# Patient Record
Sex: Female | Born: 1945 | Race: Black or African American | Hispanic: No | Marital: Married
Health system: Midwestern US, Academic
[De-identification: ages and names within clinical notes are randomized; demographics above are authoritative.]

---

## 2017-05-17 LAB — LIPID PROFILE
Lab: 12
Lab: 64

## 2017-05-17 LAB — COMPREHENSIVE METABOLIC PANEL
Lab: 12
Lab: 24
Lab: 4.3
Lab: 61
Lab: 7
Lab: 70
Lab: 92

## 2017-05-17 LAB — HEMOGLOBIN A1C: Lab: 6 — ABNORMAL HIGH (ref 4.8–5.6)

## 2017-09-01 ENCOUNTER — Encounter: Admit: 2017-09-01 | Discharge: 2017-09-01 | Payer: MEDICARE

## 2017-09-02 ENCOUNTER — Encounter: Admit: 2017-09-02 | Discharge: 2017-09-02 | Payer: MEDICARE

## 2017-09-13 ENCOUNTER — Ambulatory Visit: Admit: 2017-09-13 | Discharge: 2017-09-14 | Payer: MEDICARE

## 2017-09-13 ENCOUNTER — Encounter: Admit: 2017-09-13 | Discharge: 2017-09-13 | Payer: MEDICARE

## 2017-09-13 DIAGNOSIS — I440 Atrioventricular block, first degree: ICD-10-CM

## 2017-09-13 DIAGNOSIS — E785 Hyperlipidemia, unspecified: ICD-10-CM

## 2017-09-13 DIAGNOSIS — I10 Essential (primary) hypertension: Principal | ICD-10-CM

## 2017-09-13 DIAGNOSIS — I4892 Unspecified atrial flutter: ICD-10-CM

## 2017-09-13 DIAGNOSIS — I517 Cardiomegaly: ICD-10-CM

## 2017-09-13 DIAGNOSIS — I480 Paroxysmal atrial fibrillation: ICD-10-CM

## 2017-09-13 DIAGNOSIS — E669 Obesity, unspecified: ICD-10-CM

## 2017-09-13 DIAGNOSIS — E7800 Pure hypercholesterolemia, unspecified: ICD-10-CM

## 2017-09-13 DIAGNOSIS — I483 Typical atrial flutter: Principal | ICD-10-CM

## 2017-09-13 DIAGNOSIS — R001 Bradycardia, unspecified: ICD-10-CM

## 2017-09-14 ENCOUNTER — Encounter: Admit: 2017-09-14 | Discharge: 2017-09-14 | Payer: MEDICARE

## 2017-09-21 ENCOUNTER — Encounter: Admit: 2017-09-21 | Discharge: 2017-09-21 | Payer: MEDICARE

## 2017-09-21 ENCOUNTER — Ambulatory Visit: Admit: 2017-09-21 | Discharge: 2017-09-22 | Payer: MEDICARE

## 2017-09-21 DIAGNOSIS — R001 Bradycardia, unspecified: Secondary | ICD-10-CM

## 2017-09-21 DIAGNOSIS — E785 Hyperlipidemia, unspecified: ICD-10-CM

## 2017-09-21 DIAGNOSIS — I4891 Unspecified atrial fibrillation: Principal | ICD-10-CM

## 2017-09-21 DIAGNOSIS — E669 Obesity, unspecified: ICD-10-CM

## 2017-09-21 DIAGNOSIS — I10 Essential (primary) hypertension: Principal | ICD-10-CM

## 2017-09-21 DIAGNOSIS — I517 Cardiomegaly: ICD-10-CM

## 2017-09-21 DIAGNOSIS — I482 Chronic atrial fibrillation, unspecified: ICD-10-CM

## 2017-09-21 DIAGNOSIS — I480 Paroxysmal atrial fibrillation: ICD-10-CM

## 2017-09-21 DIAGNOSIS — Z9889 Other specified postprocedural states: Principal | ICD-10-CM

## 2017-09-21 DIAGNOSIS — I4892 Unspecified atrial flutter: ICD-10-CM

## 2017-09-21 MED ORDER — LIDOCAINE HCL 2 % MM JELP
Freq: Once | TOPICAL | 0 refills | Status: CN
Start: 2017-09-21 — End: ?

## 2017-09-21 MED ORDER — PANTOPRAZOLE 40 MG PO TBEC
40 mg | ORAL_TABLET | Freq: Two times a day (BID) | ORAL | 0 refills | 90.00000 days | Status: AC
Start: 2017-09-21 — End: 2018-08-01

## 2017-09-21 MED ORDER — SODIUM CHLORIDE 0.9 % IV SOLP
INTRAVENOUS | 0 refills | Status: CN
Start: 2017-09-21 — End: ?

## 2017-09-21 MED ORDER — LIDOCAINE (PF) 10 MG/ML (1 %) IJ SOLN
.1-2 mL | INTRAMUSCULAR | 0 refills | Status: CN | PRN
Start: 2017-09-21 — End: ?

## 2017-09-22 ENCOUNTER — Encounter: Admit: 2017-09-22 | Discharge: 2017-09-22 | Payer: MEDICARE

## 2017-09-22 DIAGNOSIS — I480 Paroxysmal atrial fibrillation: Secondary | ICD-10-CM

## 2017-09-22 DIAGNOSIS — I484 Atypical atrial flutter: Principal | ICD-10-CM

## 2017-09-22 DIAGNOSIS — R001 Bradycardia, unspecified: ICD-10-CM

## 2017-09-23 ENCOUNTER — Ambulatory Visit: Admit: 2017-09-23 | Discharge: 2017-09-23 | Payer: MEDICARE

## 2017-09-23 DIAGNOSIS — I480 Paroxysmal atrial fibrillation: Principal | ICD-10-CM

## 2017-09-29 ENCOUNTER — Encounter: Admit: 2017-09-29 | Discharge: 2017-09-29 | Payer: MEDICARE

## 2017-09-29 DIAGNOSIS — I480 Paroxysmal atrial fibrillation: Principal | ICD-10-CM

## 2017-10-03 ENCOUNTER — Encounter: Admit: 2017-10-03 | Discharge: 2017-10-03 | Payer: MEDICARE

## 2017-10-03 MED ORDER — MAGNESIUM HYDROXIDE 2,400 MG/10 ML PO SUSP
10 mL | ORAL | 0 refills | Status: CN | PRN
Start: 2017-10-03 — End: ?

## 2017-10-03 MED ORDER — ACETAMINOPHEN 325 MG PO TAB
650 mg | ORAL | 0 refills | Status: CN | PRN
Start: 2017-10-03 — End: ?

## 2017-10-04 ENCOUNTER — Ambulatory Visit: Admit: 2017-10-04 | Discharge: 2017-10-04 | Payer: MEDICARE

## 2017-10-04 ENCOUNTER — Encounter: Admit: 2017-10-04 | Discharge: 2017-10-04 | Payer: MEDICARE

## 2017-10-04 ENCOUNTER — Ambulatory Visit: Admit: 2017-10-04 | Discharge: 2017-10-05 | Payer: MEDICARE

## 2017-10-04 DIAGNOSIS — I480 Paroxysmal atrial fibrillation: ICD-10-CM

## 2017-10-04 DIAGNOSIS — I517 Cardiomegaly: ICD-10-CM

## 2017-10-04 DIAGNOSIS — I10 Essential (primary) hypertension: Principal | ICD-10-CM

## 2017-10-04 DIAGNOSIS — Z01810 Encounter for preprocedural cardiovascular examination: ICD-10-CM

## 2017-10-04 DIAGNOSIS — R001 Bradycardia, unspecified: ICD-10-CM

## 2017-10-04 DIAGNOSIS — E785 Hyperlipidemia, unspecified: ICD-10-CM

## 2017-10-04 DIAGNOSIS — Z9889 Other specified postprocedural states: ICD-10-CM

## 2017-10-04 DIAGNOSIS — I482 Chronic atrial fibrillation: Principal | ICD-10-CM

## 2017-10-04 DIAGNOSIS — E669 Obesity, unspecified: ICD-10-CM

## 2017-10-04 DIAGNOSIS — Z8679 Personal history of other diseases of the circulatory system: ICD-10-CM

## 2017-10-04 DIAGNOSIS — I4892 Unspecified atrial flutter: ICD-10-CM

## 2017-10-04 LAB — BASIC METABOLIC PANEL
Lab: 105 mg/dL — ABNORMAL HIGH (ref 70–100)
Lab: 138 MMOL/L (ref 137–147)
Lab: 20 mg/dL (ref 7–25)
Lab: 28 MMOL/L (ref 21–30)
Lab: 4.1 MMOL/L (ref 3.5–5.1)

## 2017-10-04 MED ORDER — IOPAMIDOL 76 % IV SOLN
80 mL | Freq: Once | INTRAVENOUS | 0 refills | Status: CP
Start: 2017-10-04 — End: ?
  Administered 2017-10-04: 16:00:00 80 mL via INTRAVENOUS

## 2017-10-04 MED ORDER — SODIUM CHLORIDE 0.9 % IJ SOLN
50 mL | Freq: Once | INTRAVENOUS | 0 refills | Status: CP
Start: 2017-10-04 — End: ?
  Administered 2017-10-04: 16:00:00 50 mL via INTRAVENOUS

## 2017-10-05 ENCOUNTER — Encounter: Admit: 2017-10-05 | Discharge: 2017-10-05 | Payer: MEDICARE

## 2017-10-05 DIAGNOSIS — Z9889 Other specified postprocedural states: Principal | ICD-10-CM

## 2017-10-05 DIAGNOSIS — I480 Paroxysmal atrial fibrillation: ICD-10-CM

## 2017-10-06 ENCOUNTER — Encounter: Admit: 2017-10-06 | Discharge: 2017-10-06 | Payer: MEDICARE

## 2017-10-06 ENCOUNTER — Ambulatory Visit: Admit: 2017-10-06 | Discharge: 2017-10-06 | Payer: MEDICARE

## 2017-10-06 ENCOUNTER — Encounter: Admit: 2017-10-06 | Discharge: 2017-10-07 | Payer: MEDICARE

## 2017-10-06 DIAGNOSIS — E669 Obesity, unspecified: ICD-10-CM

## 2017-10-06 DIAGNOSIS — I4892 Unspecified atrial flutter: ICD-10-CM

## 2017-10-06 DIAGNOSIS — R001 Bradycardia, unspecified: ICD-10-CM

## 2017-10-06 DIAGNOSIS — E785 Hyperlipidemia, unspecified: ICD-10-CM

## 2017-10-06 DIAGNOSIS — I480 Paroxysmal atrial fibrillation: ICD-10-CM

## 2017-10-06 DIAGNOSIS — I10 Essential (primary) hypertension: Principal | ICD-10-CM

## 2017-10-06 DIAGNOSIS — I517 Cardiomegaly: ICD-10-CM

## 2017-10-06 LAB — POC ACTIVATED CLOTTING TIME
Lab: 184 s
Lab: 246 s
Lab: 286 s
Lab: 371 s
Lab: 379 s

## 2017-10-06 LAB — POC PT/INR: Lab: 1.2 (ref 0.8–1.2)

## 2017-10-06 MED ORDER — PROMETHAZINE 25 MG/ML IJ SOLN
6.25 mg | INTRAVENOUS | 0 refills | Status: DC | PRN
Start: 2017-10-06 — End: 2017-10-07

## 2017-10-06 MED ORDER — APIXABAN 5 MG PO TAB
5 mg | Freq: Two times a day (BID) | ORAL | 0 refills | Status: DC
Start: 2017-10-06 — End: 2017-10-07
  Administered 2017-10-07: 05:00:00 5 mg via ORAL

## 2017-10-06 MED ORDER — METOCLOPRAMIDE HCL 5 MG/ML IJ SOLN
10 mg | Freq: Once | INTRAVENOUS | 0 refills | Status: AC | PRN
Start: 2017-10-06 — End: ?

## 2017-10-06 MED ORDER — PROPOFOL INJ 10 MG/ML IV VIAL
0 refills | Status: DC
Start: 2017-10-06 — End: 2017-10-06

## 2017-10-06 MED ORDER — PHENYLEPHRINE IV DRIP (STD CONC)
0 refills | Status: DC
Start: 2017-10-06 — End: 2017-10-06

## 2017-10-06 MED ORDER — FENTANYL CITRATE (PF) 50 MCG/ML IJ SOLN
0 refills | Status: DC
Start: 2017-10-06 — End: 2017-10-06

## 2017-10-06 MED ORDER — MAGNESIUM HYDROXIDE 2,400 MG/10 ML PO SUSP
10 mL | ORAL | 0 refills | Status: DC | PRN
Start: 2017-10-06 — End: 2017-10-07

## 2017-10-06 MED ORDER — LISINOPRIL 20 MG PO TAB
40 mg | Freq: Every day | ORAL | 0 refills | Status: DC
Start: 2017-10-06 — End: 2017-10-07
  Administered 2017-10-07: 03:00:00 40 mg via ORAL

## 2017-10-06 MED ORDER — ACETAMINOPHEN 325 MG PO TAB
650 mg | ORAL | 0 refills | Status: DC | PRN
Start: 2017-10-06 — End: 2017-10-07

## 2017-10-06 MED ORDER — PROTAMINE IVPB
20 mg | INTRAVENOUS | 0 refills | Status: AC | PRN
Start: 2017-10-06 — End: ?

## 2017-10-06 MED ORDER — ONDANSETRON HCL (PF) 4 MG/2 ML IJ SOLN
INTRAVENOUS | 0 refills | Status: DC
Start: 2017-10-06 — End: 2017-10-06

## 2017-10-06 MED ORDER — HYDROCHLOROTHIAZIDE 25 MG PO TAB
25 mg | Freq: Every day | ORAL | 0 refills | Status: DC
Start: 2017-10-06 — End: 2017-10-07

## 2017-10-06 MED ORDER — SUCCINYLCHOLINE CHLORIDE 20 MG/ML IJ SOLN
INTRAVENOUS | 0 refills | Status: DC
Start: 2017-10-06 — End: 2017-10-06

## 2017-10-06 MED ORDER — SODIUM CHLORIDE 0.9 % IV SOLP
INTRAVENOUS | 0 refills | Status: DC
Start: 2017-10-06 — End: 2017-10-07
  Administered 2017-10-06 (×2): 1000.000 mL via INTRAVENOUS

## 2017-10-06 MED ORDER — FENTANYL CITRATE (PF) 50 MCG/ML IJ SOLN
50 ug | Freq: Once | INTRAVENOUS | 0 refills | Status: AC | PRN
Start: 2017-10-06 — End: ?

## 2017-10-06 MED ORDER — SIMVASTATIN 40 MG PO TAB
40 mg | Freq: Every evening | ORAL | 0 refills | Status: DC
Start: 2017-10-06 — End: 2017-10-07
  Administered 2017-10-07: 03:00:00 40 mg via ORAL

## 2017-10-06 MED ORDER — PROPOFOL INJ 10 MG/ML IV VIAL
0 refills | Status: DC
Start: 2017-10-06 — End: 2017-10-06
  Administered 2017-10-06 (×3): 40 mg via INTRAVENOUS

## 2017-10-06 MED ORDER — LIDOCAINE HCL 2 % MM JELP
Freq: Once | TOPICAL | 0 refills | Status: DC
Start: 2017-10-06 — End: 2017-10-07

## 2017-10-06 MED ORDER — PANTOPRAZOLE 40 MG PO TBEC
40 mg | Freq: Two times a day (BID) | ORAL | 0 refills | Status: DC
Start: 2017-10-06 — End: 2017-10-07
  Administered 2017-10-07: 03:00:00 40 mg via ORAL

## 2017-10-06 MED ORDER — FENTANYL CITRATE (PF) 50 MCG/ML IJ SOLN
25 ug | INTRAVENOUS | 0 refills | Status: DC | PRN
Start: 2017-10-06 — End: 2017-10-07

## 2017-10-06 MED ORDER — PROPOFOL 10 MG/ML IV EMUL 20 ML (INFUSION)(AM)(OR)
INTRAVENOUS | 0 refills | Status: DC
Start: 2017-10-06 — End: 2017-10-06
  Administered 2017-10-06: 17:00:00 140 ug/kg/min via INTRAVENOUS

## 2017-10-06 MED ORDER — HYDROCODONE-ACETAMINOPHEN 5-325 MG PO TAB
1 | ORAL | 0 refills | Status: DC | PRN
Start: 2017-10-06 — End: 2017-10-07

## 2017-10-06 MED ORDER — LIDOCAINE (PF) 200 MG/10 ML (2 %) IJ SYRG
0 refills | Status: DC
Start: 2017-10-06 — End: 2017-10-06

## 2017-10-06 MED ORDER — BENZOCAINE-MENTHOL 6-10 MG MM LOZG
1 | ORAL | 0 refills | Status: DC | PRN
Start: 2017-10-06 — End: 2017-10-07

## 2017-10-06 MED ORDER — LIDOCAINE (PF) 10 MG/ML (1 %) IJ SOLN
.1-2 mL | INTRAMUSCULAR | 0 refills | Status: DC | PRN
Start: 2017-10-06 — End: 2017-10-07

## 2017-10-06 MED ORDER — DIPHENHYDRAMINE HCL 50 MG/ML IJ SOLN
25 mg | Freq: Once | INTRAVENOUS | 0 refills | Status: AC | PRN
Start: 2017-10-06 — End: ?

## 2017-10-07 ENCOUNTER — Encounter: Admit: 2017-10-07 | Discharge: 2017-10-07 | Payer: MEDICARE

## 2017-10-07 DIAGNOSIS — R001 Bradycardia, unspecified: ICD-10-CM

## 2017-10-07 DIAGNOSIS — I484 Atypical atrial flutter: Principal | ICD-10-CM

## 2017-10-07 DIAGNOSIS — I440 Atrioventricular block, first degree: ICD-10-CM

## 2017-10-07 DIAGNOSIS — Z0183 Encounter for blood typing: ICD-10-CM

## 2017-10-07 DIAGNOSIS — I480 Paroxysmal atrial fibrillation: ICD-10-CM

## 2017-10-07 DIAGNOSIS — I441 Atrioventricular block, second degree: ICD-10-CM

## 2017-10-07 DIAGNOSIS — I4891 Unspecified atrial fibrillation: ICD-10-CM

## 2017-10-07 LAB — POC ACTIVATED CLOTTING TIME
Lab: 221 s
Lab: 262 s

## 2017-10-07 LAB — BASIC METABOLIC PANEL: Lab: 140 MMOL/L — ABNORMAL LOW (ref <150)

## 2017-10-07 LAB — CBC: Lab: 8.2 K/UL — ABNORMAL LOW (ref >60)

## 2017-10-07 MED ORDER — SODIUM CHLORIDE 0.9 % IV SOLP
INTRAVENOUS | 0 refills | Status: CN
Start: 2017-10-07 — End: ?

## 2017-10-07 MED ORDER — CEFAZOLIN INJ 1GM IVP
1 g | INTRAVENOUS | 0 refills | Status: CN
Start: 2017-10-07 — End: ?

## 2017-10-07 MED ORDER — LIDOCAINE (PF) 10 MG/ML (1 %) IJ SOLN
.1-2 mL | INTRAMUSCULAR | 0 refills | Status: CN | PRN
Start: 2017-10-07 — End: ?

## 2017-10-07 MED ORDER — CEFAZOLIN INJ 1GM IVP
2 g | Freq: Once | INTRAVENOUS | 0 refills | Status: CN
Start: 2017-10-07 — End: ?

## 2017-10-07 MED ORDER — ALUMINUM-MAGNESIUM HYDROXIDE 200-200 MG/5 ML PO SUSP
30 mL | Freq: Once | ORAL | 0 refills | Status: CP
Start: 2017-10-07 — End: ?
  Administered 2017-10-07: 10:00:00 30 mL via ORAL

## 2017-10-10 ENCOUNTER — Encounter: Admit: 2017-10-10 | Discharge: 2017-10-10 | Payer: MEDICARE

## 2017-10-10 DIAGNOSIS — E669 Obesity, unspecified: ICD-10-CM

## 2017-10-10 DIAGNOSIS — I480 Paroxysmal atrial fibrillation: ICD-10-CM

## 2017-10-10 DIAGNOSIS — E785 Hyperlipidemia, unspecified: ICD-10-CM

## 2017-10-10 DIAGNOSIS — R001 Bradycardia, unspecified: ICD-10-CM

## 2017-10-10 DIAGNOSIS — I517 Cardiomegaly: ICD-10-CM

## 2017-10-10 DIAGNOSIS — I4892 Unspecified atrial flutter: ICD-10-CM

## 2017-10-10 DIAGNOSIS — I10 Essential (primary) hypertension: Principal | ICD-10-CM

## 2017-10-12 ENCOUNTER — Encounter: Admit: 2017-10-12 | Discharge: 2017-10-12 | Payer: MEDICARE

## 2017-10-12 DIAGNOSIS — I517 Cardiomegaly: ICD-10-CM

## 2017-10-12 DIAGNOSIS — I10 Essential (primary) hypertension: Principal | ICD-10-CM

## 2017-10-12 DIAGNOSIS — I4892 Unspecified atrial flutter: ICD-10-CM

## 2017-10-12 DIAGNOSIS — E669 Obesity, unspecified: ICD-10-CM

## 2017-10-12 DIAGNOSIS — E785 Hyperlipidemia, unspecified: ICD-10-CM

## 2017-10-12 DIAGNOSIS — R001 Bradycardia, unspecified: ICD-10-CM

## 2017-10-12 DIAGNOSIS — I480 Paroxysmal atrial fibrillation: ICD-10-CM

## 2017-10-13 ENCOUNTER — Encounter: Admit: 2017-10-13 | Discharge: 2017-10-13 | Payer: MEDICARE

## 2017-10-13 DIAGNOSIS — R002 Palpitations: Principal | ICD-10-CM

## 2017-10-14 ENCOUNTER — Encounter: Admit: 2017-10-14 | Discharge: 2017-10-14 | Payer: MEDICARE

## 2017-10-14 ENCOUNTER — Ambulatory Visit: Admit: 2017-10-14 | Discharge: 2017-10-15 | Payer: MEDICARE

## 2017-10-15 DIAGNOSIS — R002 Palpitations: Principal | ICD-10-CM

## 2017-10-27 ENCOUNTER — Encounter: Admit: 2017-10-27 | Discharge: 2017-10-27 | Payer: MEDICARE

## 2017-10-28 ENCOUNTER — Encounter: Admit: 2017-10-28 | Discharge: 2017-10-28 | Payer: MEDICARE

## 2017-10-28 MED ORDER — ACETAMINOPHEN 325 MG PO TAB
650 mg | ORAL | 0 refills | Status: CN | PRN
Start: 2017-10-28 — End: ?

## 2017-10-28 MED ORDER — DOCUSATE SODIUM 100 MG PO CAP
100 mg | Freq: Every day | ORAL | 0 refills | Status: CN | PRN
Start: 2017-10-28 — End: ?

## 2017-10-28 MED ORDER — TEMAZEPAM 15 MG PO CAP
15 mg | Freq: Every evening | ORAL | 0 refills | Status: CN | PRN
Start: 2017-10-28 — End: ?

## 2017-10-28 MED ORDER — ALUMINUM-MAGNESIUM HYDROXIDE 200-200 MG/5 ML PO SUSP
30 mL | ORAL | 0 refills | Status: CN | PRN
Start: 2017-10-28 — End: ?

## 2017-11-01 ENCOUNTER — Encounter: Admit: 2017-11-01 | Discharge: 2017-11-01 | Payer: MEDICARE

## 2017-11-03 ENCOUNTER — Encounter: Admit: 2017-11-03 | Discharge: 2017-11-03 | Payer: MEDICARE

## 2017-11-03 ENCOUNTER — Ambulatory Visit: Admit: 2017-11-03 | Discharge: 2017-11-03 | Payer: MEDICARE

## 2017-11-03 ENCOUNTER — Encounter: Admit: 2017-11-03 | Discharge: 2017-11-04 | Payer: MEDICARE

## 2017-11-03 DIAGNOSIS — I480 Paroxysmal atrial fibrillation: ICD-10-CM

## 2017-11-03 DIAGNOSIS — R001 Bradycardia, unspecified: ICD-10-CM

## 2017-11-03 DIAGNOSIS — I517 Cardiomegaly: ICD-10-CM

## 2017-11-03 DIAGNOSIS — E785 Hyperlipidemia, unspecified: ICD-10-CM

## 2017-11-03 DIAGNOSIS — E669 Obesity, unspecified: ICD-10-CM

## 2017-11-03 DIAGNOSIS — I10 Essential (primary) hypertension: Principal | ICD-10-CM

## 2017-11-03 DIAGNOSIS — I4892 Unspecified atrial flutter: ICD-10-CM

## 2017-11-03 LAB — COMPREHENSIVE METABOLIC PANEL
Lab: 0.5 mg/dL (ref 0.3–1.2)
Lab: 1.1 mg/dL — ABNORMAL HIGH (ref 0.4–1.00)
Lab: 104 MMOL/L (ref 98–110)
Lab: 113 mg/dL — ABNORMAL HIGH (ref 70–100)
Lab: 138 MMOL/L (ref 137–147)
Lab: 18 U/L (ref 7–56)
Lab: 21 U/L (ref 7–40)
Lab: 21 mg/dL (ref 7–25)
Lab: 27 MMOL/L (ref 21–30)
Lab: 4.1 g/dL — ABNORMAL LOW (ref 3.5–5.0)
Lab: 49 mL/min — ABNORMAL LOW (ref >60)
Lab: 59 mL/min — ABNORMAL LOW (ref >60)
Lab: 7 K/UL (ref 3–12)
Lab: 7.2 g/dL (ref 6.0–8.0)
Lab: 80 U/L — ABNORMAL HIGH (ref 25–110)
Lab: 9.6 mg/dL (ref 8.5–10.6)

## 2017-11-03 LAB — CBC AND DIFF
Lab: 0.1 10*3/uL (ref 0–0.20)
Lab: 0.2 10*3/uL (ref 0–0.45)
Lab: 4.8 10*3/uL (ref 4.5–11.0)

## 2017-11-03 LAB — MAGNESIUM: Lab: 2.2 mg/dL (ref 1.6–2.6)

## 2017-11-03 MED ORDER — TEMAZEPAM 15 MG PO CAP
15 mg | Freq: Every evening | ORAL | 0 refills | Status: DC | PRN
Start: 2017-11-03 — End: 2017-11-04

## 2017-11-03 MED ORDER — LISINOPRIL 20 MG PO TAB
40 mg | Freq: Every day | ORAL | 0 refills | Status: DC
Start: 2017-11-03 — End: 2017-11-04
  Administered 2017-11-04: 14:00:00 40 mg via ORAL

## 2017-11-03 MED ORDER — HYDROCHLOROTHIAZIDE 12.5 MG PO TAB
12.5 mg | Freq: Every day | ORAL | 0 refills | Status: DC
Start: 2017-11-03 — End: 2017-11-04
  Administered 2017-11-04: 14:00:00 12.5 mg via ORAL

## 2017-11-03 MED ORDER — CEFAZOLIN INJ 1GM IVP
2 g | Freq: Once | INTRAVENOUS | 0 refills | Status: CP
Start: 2017-11-03 — End: ?

## 2017-11-03 MED ORDER — APIXABAN 5 MG PO TAB
5 mg | Freq: Two times a day (BID) | ORAL | 0 refills | Status: DC
Start: 2017-11-03 — End: 2017-11-04
  Administered 2017-11-04 (×2): 5 mg via ORAL

## 2017-11-03 MED ORDER — SIMVASTATIN 40 MG PO TAB
40 mg | Freq: Every evening | ORAL | 0 refills | Status: DC
Start: 2017-11-03 — End: 2017-11-04
  Administered 2017-11-04: 02:00:00 40 mg via ORAL

## 2017-11-03 MED ORDER — HYDROCODONE-ACETAMINOPHEN 5-325 MG PO TAB
1 | ORAL | 0 refills | Status: DC | PRN
Start: 2017-11-03 — End: 2017-11-04
  Administered 2017-11-03 – 2017-11-04 (×2): 1 via ORAL

## 2017-11-03 MED ORDER — ACETAMINOPHEN 325 MG PO TAB
650 mg | ORAL | 0 refills | Status: DC | PRN
Start: 2017-11-03 — End: 2017-11-03

## 2017-11-03 MED ORDER — ACETAMINOPHEN 325 MG PO TAB
325-650 mg | ORAL | 0 refills | Status: DC | PRN
Start: 2017-11-03 — End: 2017-11-04
  Administered 2017-11-04: 12:00:00 650 mg via ORAL

## 2017-11-03 MED ORDER — SODIUM CHLORIDE 0.9 % IV SOLP
INTRAVENOUS | 0 refills | Status: DC
Start: 2017-11-03 — End: 2017-11-04
  Administered 2017-11-03: 12:00:00 1000 mL via INTRAVENOUS

## 2017-11-03 MED ORDER — LIDOCAINE (PF) 10 MG/ML (1 %) IJ SOLN
.1-2 mL | INTRAMUSCULAR | 0 refills | Status: DC | PRN
Start: 2017-11-03 — End: 2017-11-04

## 2017-11-03 MED ORDER — CEFAZOLIN INJ 1GM IVP
1 g | INTRAVENOUS | 0 refills | Status: CP
Start: 2017-11-03 — End: ?
  Administered 2017-11-03: 22:00:00 1 g via INTRAVENOUS

## 2017-11-03 MED ORDER — PANTOPRAZOLE 40 MG PO TBEC
40 mg | Freq: Two times a day (BID) | ORAL | 0 refills | Status: DC
Start: 2017-11-03 — End: 2017-11-04
  Administered 2017-11-04 (×2): 40 mg via ORAL

## 2017-11-03 MED ORDER — LISINOPRIL 20 MG PO TAB
40 mg | Freq: Once | ORAL | 0 refills | Status: CP
Start: 2017-11-03 — End: ?
  Administered 2017-11-03: 14:00:00 40 mg via ORAL

## 2017-11-03 MED ORDER — PANTOPRAZOLE 40 MG PO TBEC
40 mg | ORAL_TABLET | Freq: Two times a day (BID) | ORAL | 0 refills
Start: 2017-11-03 — End: ?

## 2017-11-03 MED ORDER — ALUMINUM-MAGNESIUM HYDROXIDE 200-200 MG/5 ML PO SUSP
30 mL | ORAL | 0 refills | Status: DC | PRN
Start: 2017-11-03 — End: 2017-11-04

## 2017-11-03 MED ORDER — DOCUSATE SODIUM 100 MG PO CAP
100 mg | Freq: Every day | ORAL | 0 refills | Status: DC | PRN
Start: 2017-11-03 — End: 2017-11-04

## 2017-11-04 ENCOUNTER — Encounter: Admit: 2017-11-04 | Discharge: 2017-11-04 | Payer: MEDICARE

## 2017-11-04 ENCOUNTER — Encounter: Admit: 2017-11-04 | Discharge: 2017-11-05 | Payer: MEDICARE

## 2017-11-04 DIAGNOSIS — R001 Bradycardia, unspecified: Secondary | ICD-10-CM

## 2017-11-04 DIAGNOSIS — I441 Atrioventricular block, second degree: Principal | ICD-10-CM

## 2017-11-04 LAB — BASIC METABOLIC PANEL
Lab: 0.8 mg/dL (ref 0.4–1.00)
Lab: 101 mg/dL — ABNORMAL HIGH (ref 70–100)
Lab: 138 MMOL/L (ref 137–147)
Lab: 15 mg/dL (ref 7–25)
Lab: 25 MMOL/L (ref 21–30)
Lab: 3.4 MMOL/L — ABNORMAL LOW (ref 3.5–5.1)
Lab: 8.4 mg/dL — ABNORMAL LOW (ref 8.5–10.6)
Lab: >60 mL/min (ref >60)
Lab: >60 mL/min (ref >60)

## 2017-11-04 LAB — CBC: Lab: 5.8 10*3/uL (ref 4.5–11.0)

## 2017-11-04 MED ORDER — HYDROCODONE-ACETAMINOPHEN 5-300 MG PO TAB
1 | ORAL_TABLET | ORAL | 0 refills | 30.00000 days | Status: AC | PRN
Start: 2017-11-04 — End: 2018-08-01

## 2017-11-04 MED ORDER — POTASSIUM CHLORIDE 20 MEQ PO TBTQ
40 meq | Freq: Once | ORAL | 0 refills | Status: CP
Start: 2017-11-04 — End: ?
  Administered 2017-11-04: 14:00:00 40 meq via ORAL

## 2017-11-07 ENCOUNTER — Encounter: Admit: 2017-11-07 | Discharge: 2017-11-07 | Payer: MEDICARE

## 2017-11-08 ENCOUNTER — Encounter: Admit: 2017-11-08 | Discharge: 2017-11-08 | Payer: MEDICARE

## 2017-11-14 ENCOUNTER — Encounter: Admit: 2017-11-14 | Discharge: 2017-11-14 | Payer: MEDICARE

## 2017-11-14 ENCOUNTER — Ambulatory Visit: Admit: 2017-11-14 | Discharge: 2017-11-15 | Payer: MEDICARE

## 2017-11-14 DIAGNOSIS — I4819 Other persistent atrial fibrillation: Principal | ICD-10-CM

## 2017-11-15 ENCOUNTER — Encounter: Admit: 2017-11-15 | Discharge: 2017-11-15 | Payer: MEDICARE

## 2017-11-15 DIAGNOSIS — I481 Persistent atrial fibrillation: Principal | ICD-10-CM

## 2017-11-18 ENCOUNTER — Encounter: Admit: 2017-11-18 | Discharge: 2017-11-18 | Payer: MEDICARE

## 2017-12-26 ENCOUNTER — Encounter: Admit: 2017-12-26 | Discharge: 2017-12-26 | Payer: MEDICARE

## 2018-01-04 ENCOUNTER — Ambulatory Visit: Admit: 2018-01-04 | Discharge: 2018-01-04 | Payer: MEDICARE

## 2018-01-04 ENCOUNTER — Encounter: Admit: 2018-01-04 | Discharge: 2018-01-04 | Payer: MEDICARE

## 2018-01-04 DIAGNOSIS — Z950 Presence of cardiac pacemaker: ICD-10-CM

## 2018-01-04 DIAGNOSIS — I484 Atypical atrial flutter: ICD-10-CM

## 2018-01-04 DIAGNOSIS — I10 Essential (primary) hypertension: Principal | ICD-10-CM

## 2018-01-04 DIAGNOSIS — I481 Persistent atrial fibrillation: ICD-10-CM

## 2018-01-04 DIAGNOSIS — I517 Cardiomegaly: ICD-10-CM

## 2018-01-04 DIAGNOSIS — E669 Obesity, unspecified: ICD-10-CM

## 2018-01-04 DIAGNOSIS — I441 Atrioventricular block, second degree: Principal | ICD-10-CM

## 2018-01-04 DIAGNOSIS — R001 Bradycardia, unspecified: ICD-10-CM

## 2018-01-04 DIAGNOSIS — I4892 Unspecified atrial flutter: ICD-10-CM

## 2018-01-04 DIAGNOSIS — I4819 Other persistent atrial fibrillation: Principal | ICD-10-CM

## 2018-01-04 DIAGNOSIS — E785 Hyperlipidemia, unspecified: ICD-10-CM

## 2018-01-04 DIAGNOSIS — I480 Paroxysmal atrial fibrillation: ICD-10-CM

## 2018-01-07 ENCOUNTER — Encounter: Admit: 2018-01-07 | Discharge: 2018-01-07 | Payer: MEDICARE

## 2018-01-07 DIAGNOSIS — I517 Cardiomegaly: ICD-10-CM

## 2018-01-07 DIAGNOSIS — I480 Paroxysmal atrial fibrillation: ICD-10-CM

## 2018-01-07 DIAGNOSIS — R001 Bradycardia, unspecified: ICD-10-CM

## 2018-01-07 DIAGNOSIS — I10 Essential (primary) hypertension: Principal | ICD-10-CM

## 2018-01-07 DIAGNOSIS — E785 Hyperlipidemia, unspecified: ICD-10-CM

## 2018-01-07 DIAGNOSIS — I4892 Unspecified atrial flutter: ICD-10-CM

## 2018-01-07 DIAGNOSIS — E669 Obesity, unspecified: ICD-10-CM

## 2018-01-11 ENCOUNTER — Encounter: Admit: 2018-01-11 | Discharge: 2018-01-11 | Payer: MEDICARE

## 2018-01-16 ENCOUNTER — Encounter: Admit: 2018-01-16 | Discharge: 2018-01-16 | Payer: MEDICARE

## 2018-02-13 ENCOUNTER — Ambulatory Visit: Admit: 2018-02-13 | Discharge: 2018-02-14 | Payer: MEDICARE

## 2018-02-13 DIAGNOSIS — I481 Persistent atrial fibrillation: Secondary | ICD-10-CM

## 2018-02-13 DIAGNOSIS — I4819 Other persistent atrial fibrillation: ICD-10-CM

## 2018-02-14 DIAGNOSIS — I441 Atrioventricular block, second degree: Principal | ICD-10-CM

## 2018-02-21 ENCOUNTER — Ambulatory Visit: Admit: 2018-02-21 | Discharge: 2018-02-21 | Payer: MEDICARE

## 2018-02-21 ENCOUNTER — Ambulatory Visit: Admit: 2018-02-21 | Discharge: 2018-02-22 | Payer: MEDICARE

## 2018-02-21 ENCOUNTER — Encounter: Admit: 2018-02-21 | Discharge: 2018-02-21 | Payer: MEDICARE

## 2018-02-21 DIAGNOSIS — Z950 Presence of cardiac pacemaker: ICD-10-CM

## 2018-02-21 DIAGNOSIS — I481 Persistent atrial fibrillation: Principal | ICD-10-CM

## 2018-02-21 LAB — POC CREATININE, RAD: Lab: 1 mg/dL (ref 0.4–1.00)

## 2018-02-21 MED ORDER — SODIUM CHLORIDE 0.9 % IJ SOLN
50 mL | Freq: Once | INTRAVENOUS | 0 refills | Status: CP
Start: 2018-02-21 — End: ?
  Administered 2018-02-21: 14:00:00 50 mL via INTRAVENOUS

## 2018-02-21 MED ORDER — IOPAMIDOL 76 % IV SOLN
80 mL | Freq: Once | INTRAVENOUS | 0 refills | Status: CP
Start: 2018-02-21 — End: ?
  Administered 2018-02-21: 14:00:00 80 mL via INTRAVENOUS

## 2018-02-23 ENCOUNTER — Encounter: Admit: 2018-02-23 | Discharge: 2018-02-23 | Payer: MEDICARE

## 2018-05-08 ENCOUNTER — Encounter: Admit: 2018-05-08 | Discharge: 2018-05-08 | Payer: MEDICARE

## 2018-05-08 DIAGNOSIS — Z950 Presence of cardiac pacemaker: Principal | ICD-10-CM

## 2018-05-15 ENCOUNTER — Ambulatory Visit: Admit: 2018-05-15 | Discharge: 2018-05-15 | Payer: MEDICARE

## 2018-05-15 DIAGNOSIS — I4819 Other persistent atrial fibrillation: Principal | ICD-10-CM

## 2018-06-20 ENCOUNTER — Encounter: Admit: 2018-06-20 | Discharge: 2018-06-20 | Payer: MEDICARE

## 2018-06-20 ENCOUNTER — Ambulatory Visit: Admit: 2018-06-20 | Discharge: 2018-06-21 | Payer: MEDICARE

## 2018-06-20 DIAGNOSIS — I4819 Other persistent atrial fibrillation: Secondary | ICD-10-CM

## 2018-06-20 DIAGNOSIS — I441 Atrioventricular block, second degree: Secondary | ICD-10-CM

## 2018-06-20 DIAGNOSIS — Z950 Presence of cardiac pacemaker: Secondary | ICD-10-CM

## 2018-06-27 LAB — COMPREHENSIVE METABOLIC PANEL
Lab: 103
Lab: 16
Lab: 36
Lab: 4.4
Lab: 58 — ABNORMAL LOW (ref >60)
Lab: 67
Lab: 7.2
Lab: 7.2

## 2018-06-27 LAB — LIPID PROFILE: Lab: 10

## 2018-07-05 ENCOUNTER — Encounter: Admit: 2018-07-05 | Discharge: 2018-07-05 | Payer: MEDICARE

## 2018-08-01 ENCOUNTER — Ambulatory Visit: Admit: 2018-08-01 | Discharge: 2018-08-01 | Payer: MEDICARE

## 2018-08-01 ENCOUNTER — Encounter: Admit: 2018-08-01 | Discharge: 2018-08-01 | Payer: MEDICARE

## 2018-08-01 DIAGNOSIS — R001 Bradycardia, unspecified: ICD-10-CM

## 2018-08-01 DIAGNOSIS — Z950 Presence of cardiac pacemaker: ICD-10-CM

## 2018-08-01 DIAGNOSIS — I10 Essential (primary) hypertension: Principal | ICD-10-CM

## 2018-08-01 DIAGNOSIS — I4892 Unspecified atrial flutter: ICD-10-CM

## 2018-08-01 DIAGNOSIS — I517 Cardiomegaly: ICD-10-CM

## 2018-08-01 DIAGNOSIS — E669 Obesity, unspecified: ICD-10-CM

## 2018-08-01 DIAGNOSIS — E785 Hyperlipidemia, unspecified: ICD-10-CM

## 2018-08-01 DIAGNOSIS — I440 Atrioventricular block, first degree: ICD-10-CM

## 2018-08-01 DIAGNOSIS — I480 Paroxysmal atrial fibrillation: ICD-10-CM

## 2018-08-01 DIAGNOSIS — M170 Bilateral primary osteoarthritis of knee: Principal | ICD-10-CM

## 2018-08-01 NOTE — Progress Notes
Date of Service: 08/01/2018    Hannah Andrade is a 73 y.o. female.       HPI     Hannah Andrade reports for follow-up after she seen Dr. Bradly Bienenstock twice in clinic and had 2 procedures after I asked Hannah Andrade to see Dr. Bradly Bienenstock for possible repeat a flutter ablation.  Hannah Andrade had previous a flutter ablation in [place] but had recurrence after being too bradycardic on sotalol.  In accord with my thinking Dr. Bradly Bienenstock recommended ablation.  The procedure was successful but patient had some AV block and bradycardia afterwards so I had an elective pacemaker placed on a subsequent admission.  That she never had syncope.  She is done well with the device implant with no procedural complications no fevers chills or device infection or pain.  She is not felt sustained arrhythmia suggesting breakthrough atrial flutter or fib.    This morning she noted some sense of fluttering that she had not had before she did not think this was atrial flutter but she was wondering what it was.    She states that she is free from exertional chest pain or pressure that limits activity.  She is having no PND, orthopnea,  edema, stay tachypalpitations, syncope, near syncope, or postural lightheadedness.  She has had no  focal motor defects, speech defects or cognitive defects that would suggest TIA. She is not limiting her activity in order to avoid chest discomfort or shortness of breath with exertion.Hannah Andrade is not limiting his activities of daily living in any way to avoid exertional chest pain, pressure or shortness of air     She  is tolerating all of her medications well, is not missing any doses and has no specific complaints about any of them.           Vitals:    08/01/18 1331 08/01/18 1334   BP: 132/78 126/72   BP Source: Arm, Left Upper Arm, Right Upper   Pulse: 73    SpO2: 98%    Weight: 85.3 kg (188 lb)    Height: 1.473 m (4' 10)    PainSc: Zero      Body mass index is 39.29 kg/m???.     Past Medical History Patient Active Problem List    Diagnosis Date Noted   ??? 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 Bradly Bienenstock afpter 2nd atrial flutter ablation     ??? 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 Bradycardia 11/03/2017     Chronic HR 40s - 50s on sotalol 40 BID.   Post 2019 atrial flutter ablation bradycardia. Pacer implant Dr. Bradly Bienenstock     ??? On continuous oral anticoagulation 08/31/2016   ??? At risk for cardiovascular event: Normal cor angio 2015 08/12/2015     08/2013: Patient reports having normal coronary angiography done prior to atrial fibrillation Ablation, [facility], [facility].      ??? Primary osteoarthritis of both knees 08/12/2015     05/12/15 right TKA  [facility] Dr. Aundria Rud, no problems  2018 L TKA Dr. Aundria Rud     ??? Palpitations 09/02/2014   ??? First degree AV block 03/19/2014     03/18/14--PR interval 440  MAC OV, increased from 340 in July on Sotalol 40 BID, Sotalol DC'd      ??? S/P ablation of atrial fibrillation 01/01/2014     08/14/13 [facility] [place] Dr. Hilarie Fredrickson     ??? Atrial flutter (  HCC) 12/27/2013     06/2013 Paroxysmal atrial flutter diagnosed in ER  09/03/13 Cryoablation for atrial fib via PVI x 4 &.  RF ablation of typical cavotricuspid isthmus atrial flutter. [facility], [facility]  10/15 Fatigue & Bradycardia w/ Sotalol,DC'd. Notes increase of some palpitations but cessation of fatigue and bradycardia  09/2017 Recurrent symptomatic atrial flutter, ablated Dr. Yetta Numbers, post ablation brady>>DDD Pacer implan     ??? LAE (left atrial enlargement) 12/27/2013   ??? Obesity 12/27/2013   ??? Hypertension 12/26/2013     7/15 add HCTZ MAC Clinic Echo 01/2014:  EF 65% Walls 1.1 c.w mild LVH.          ??? Atrial fibrillation (HCC) 12/26/2013     08/14/2013: PVI for AF persistingw/ HR to 40s on Sotallol 40 BID @ [facility] Dr. Hilarie Fredrickson same setting as atrial flutter  Ablation, continues .sotalol,  Sotalol DC'd due to malainse, fatigue ??? Primary osteoarthritis of both knees    ??? Bradycardia    ??? Atrial flutter, unspecified type (HCC)    ??? Pacemaker        Assessment and Plan     Hannah Andrade is doing well.  Her pacemaker implant went well.  She is not having any problems with the pacemaker.  She was disappointed that she needed it but with her previous bradycardia on sotalol she understands that she is now guaranteed a minimum heart rate regardless of what medications she might need to be on.  The device is not causing her any discomfort.  Her atrial flutter ablation has been successful as she is now about 10 months out from the procedure.  She does not have any other active cardiovascular problems so I have put her on 1 year follow-up with me.  She can see Dr. Bradly Bienenstock as needed.  With Dr. Bradly Bienenstock not in patients in [place] is just going to follow-up with me unless something changes that requires EP specialist attention.      NB: The free text in this document was generated through Dragon(TM) software with editing and proofreading  done by the author of this document Dr. Mable Paris MD, [facility] principally at the point of care. Some errors may persist.  If there are questions about content in this document please contact Dr. Hale Bogus.    The written information I provided Hannah Andrade at the conclusion of today's encounter is as  follows:    Patient Instructions   You are doing well,   Pacer records fast rhythms, we'll do remote interrogation to see what happened this morning. We'll let you know later today.     Please establish a moderate exercise program of three hours a week. You don't need speed, just consistency. Don't overdo it in a way that hurts your bones, joints or muscles. Strength, stamina, flexibility and balance are your goals with exercise.      Here are your blood pressure goals:   Systolic (top number): Over 90-100 minimum, usually 110-130. Occasionally approaching 140,  no 150s. Diastolic (bottom number):  80 or less, rarely could accept 85-90.      Return to see me for a recheck in one year.   I can see you sooner if needed.   Call in if you have problems or questions.   Hannah Nestle, MD                Current Medications (including today's revisions)  ??? alendronate (FOSAMAX) 70 mg  tablet Take 1 tablet by mouth every 7 days.   ??? apixaban (ELIQUIS) 5 mg tablet Take 1 tablet by mouth twice daily.   ??? calcium carbonate/vitamin D-3 (OSCAL-500+D) 1250 mg/200 unit tablet Take 1 tablet by mouth daily. Calcium Carb 1250mg  delivers 500mg  elemental Ca   ??? hydrochlorothiazide (HYDRODIURIL) 25 mg tablet Take 1 Tab by mouth daily.   ??? lisinopril (PRINIVIL, ZESTRIL) 40 mg tablet Take 1 tablet by mouth daily.   ??? simvastatin (ZOCOR) 40 mg tablet Take 40 mg by mouth at bedtime daily.   ??? vitamins, multiple cap Take 1 Cap by mouth daily.

## 2018-08-14 DIAGNOSIS — I4819 Other persistent atrial fibrillation: ICD-10-CM

## 2018-08-14 DIAGNOSIS — I442 Atrioventricular block, complete: Principal | ICD-10-CM

## 2018-08-15 ENCOUNTER — Ambulatory Visit: Admit: 2018-08-15 | Discharge: 2018-08-14 | Payer: MEDICARE

## 2018-08-15 IMAGING — CT Lower Extremities^1_CONFORMIS_LOWER_EXT (Adult)
1 of 3 series · 10 of 14 positions shown, 13 images · non-contrast
Comparison: none

[Series 2: knee conform 1.0 bone · axial · 0.39mm/px · z∈[-601,-433]mm · 10 of 405 slices shown, 13 images]
[im 37/405  soft-tissue]
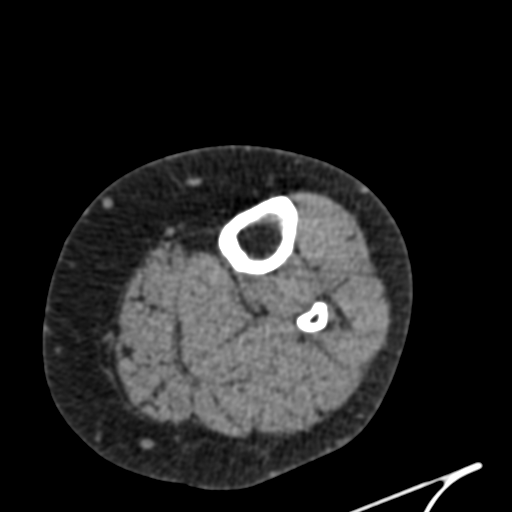
[im 37/405  bone]
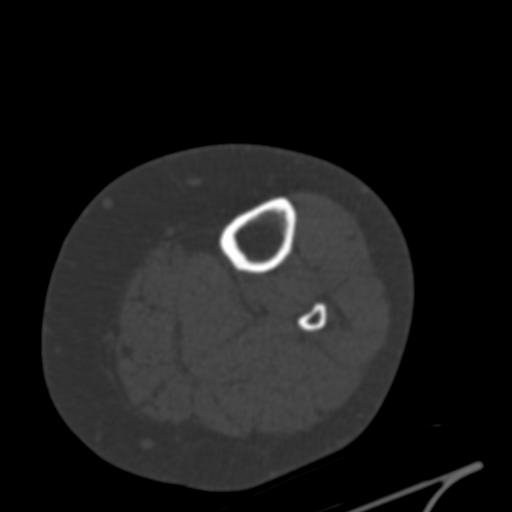
[im 74/405  bone]
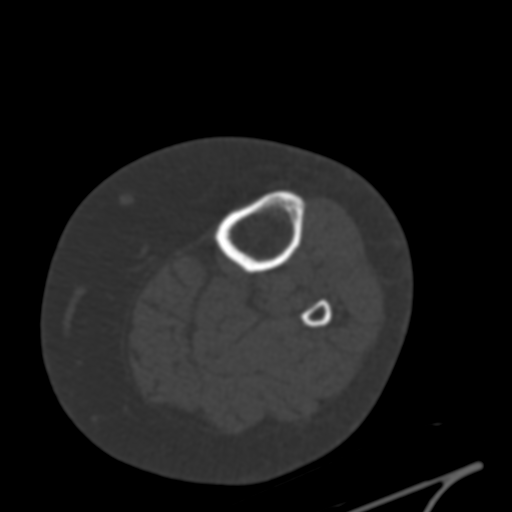
[im 111/405  bone]
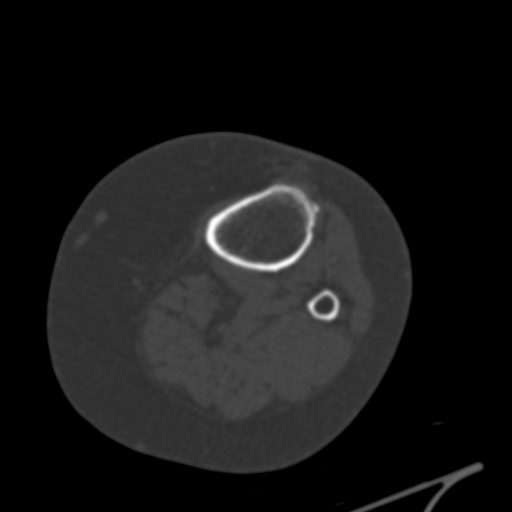
[im 147/405  bone]
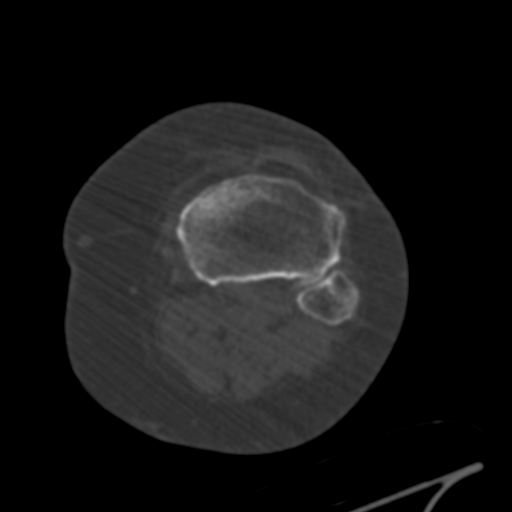
[im 184/405  soft-tissue]
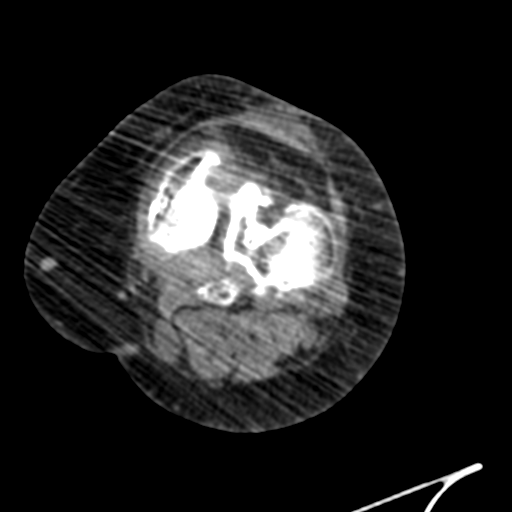
[im 184/405  bone]
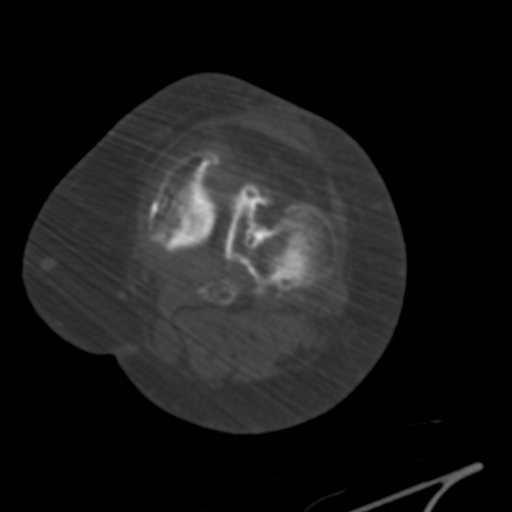
[im 221/405  bone]
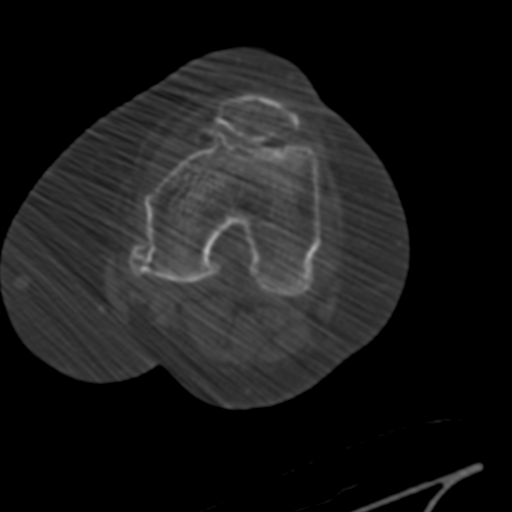
[im 258/405  bone]
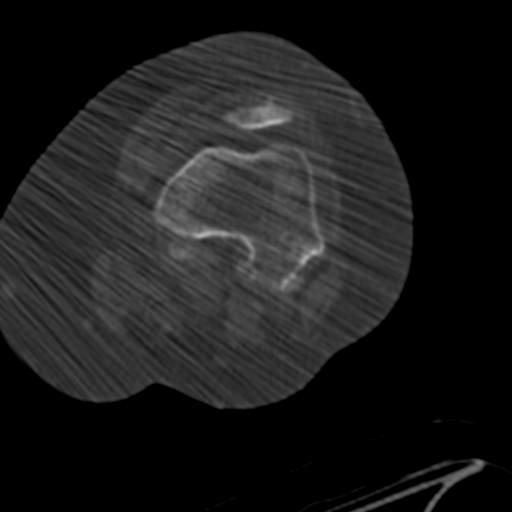
[im 294/405  bone]
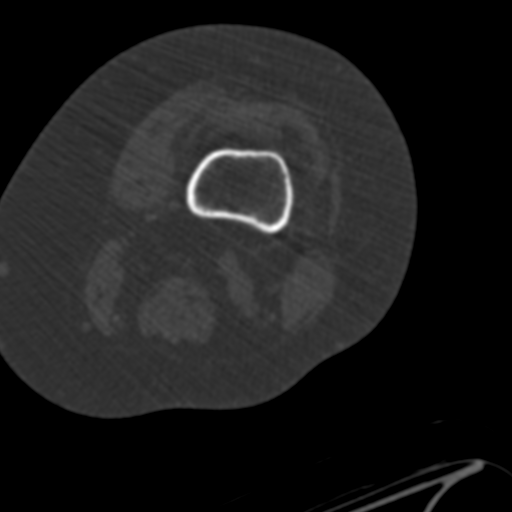
[im 331/405  soft-tissue]
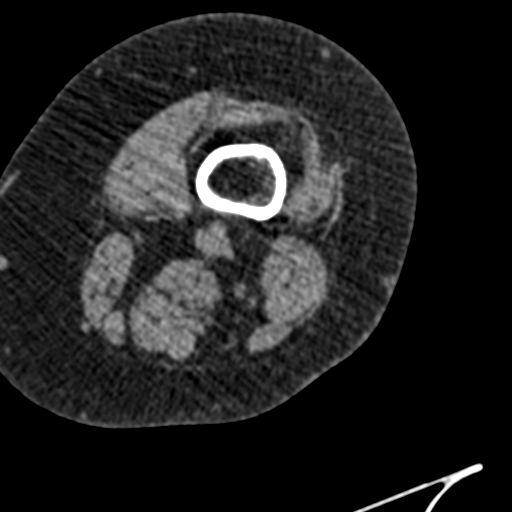
[im 331/405  bone]
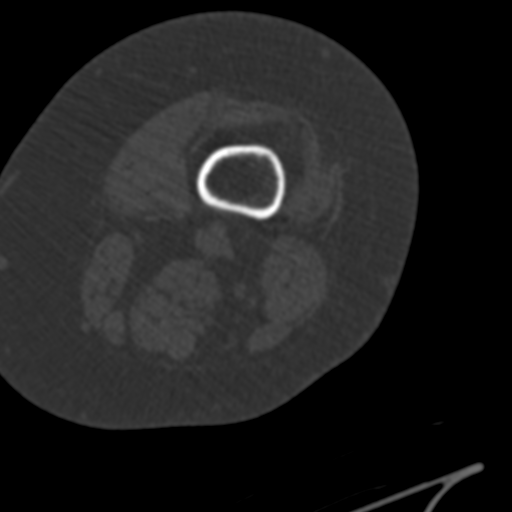
[im 368/405  bone]
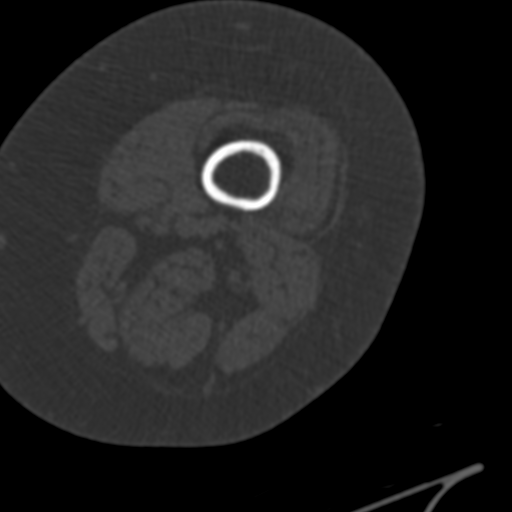

[10 of 14 positions shown; findings below may reference images not displayed]

DIAGNOSTIC STUDIES

EXAM

CT left knee, CONFORMIS protocol

INDICATION

planned knee replacement
PT STATES LEFT KNEE PAIN. PLANNED LEFT KNEE REPLACEMENT. CONFORMIS
PROTOCOL. TJ

TECHNIQUE

Axial CT of the left knee was performed with 1 millimeter thickness slices. Coronal, sagittal,
axial bone reformats were obtained. 2 millimeter slices through the hip and ankle were performed.
All CT scans at this facility use dose modulation, iterative reconstruction, and/or weight based
dosing when appropriate to reduce radiation dose to as low as reasonably achievable.

COMPARISONS

None

FINDINGS

No acute fracture. Alignment is normal. There is tricompartmental osteoarthrosis, greatest in the
patellofemoral and medial compartments where there is moderate to severe joint space narrowing.
There is bulky tricompartmental osteophyte formation, greatest in the medial compartment. Trace
joint effusion. Muscles and subcutaneous soft tissue are unremarkable.

There is left hip joint space narrowing posteriorly. The ankle is unremarkable.

IMPRESSION

Tricompartmental osteoarthrosis of the knee, greatest in the patellofemoral and medial
compartments where it is moderate to severe.

## 2018-11-13 ENCOUNTER — Encounter: Admit: 2018-11-13 | Discharge: 2018-11-13

## 2018-11-13 ENCOUNTER — Ambulatory Visit: Admit: 2018-11-13 | Discharge: 2018-11-13

## 2018-11-13 DIAGNOSIS — I4819 Other persistent atrial fibrillation: Secondary | ICD-10-CM

## 2018-11-13 DIAGNOSIS — I440 Atrioventricular block, first degree: Secondary | ICD-10-CM

## 2018-11-13 DIAGNOSIS — I480 Paroxysmal atrial fibrillation: Secondary | ICD-10-CM

## 2018-11-13 DIAGNOSIS — I441 Atrioventricular block, second degree: Secondary | ICD-10-CM

## 2018-11-21 IMAGING — CR LOW_EXM
2 series · 2 of 2 positions shown · non-contrast
Comparison: Left knee radiographs May 12, 2015 and left knee CT June 29, 2016.

EXAM

2 VIEW LEFT KNEE
INDICATION
S/P Replacement of Knee
S/P KNEE REPLACEMENT

[knee ap]
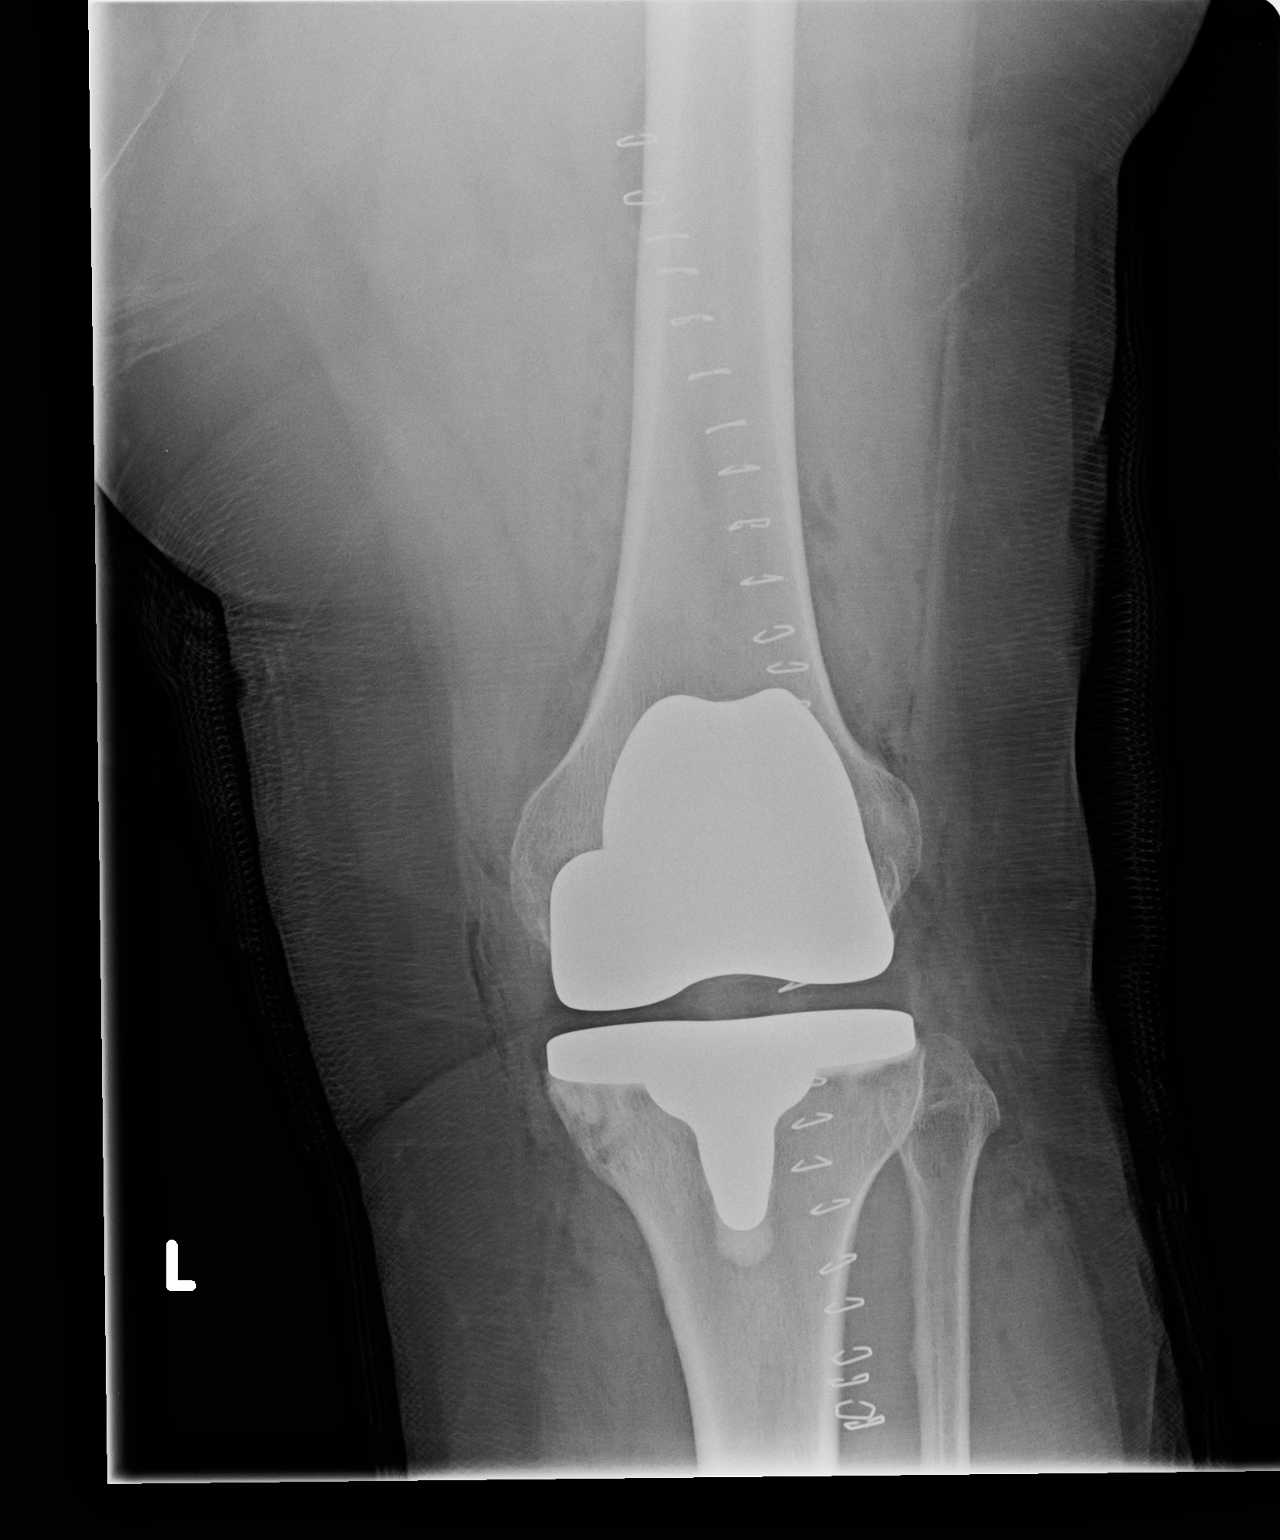

[knee lat]
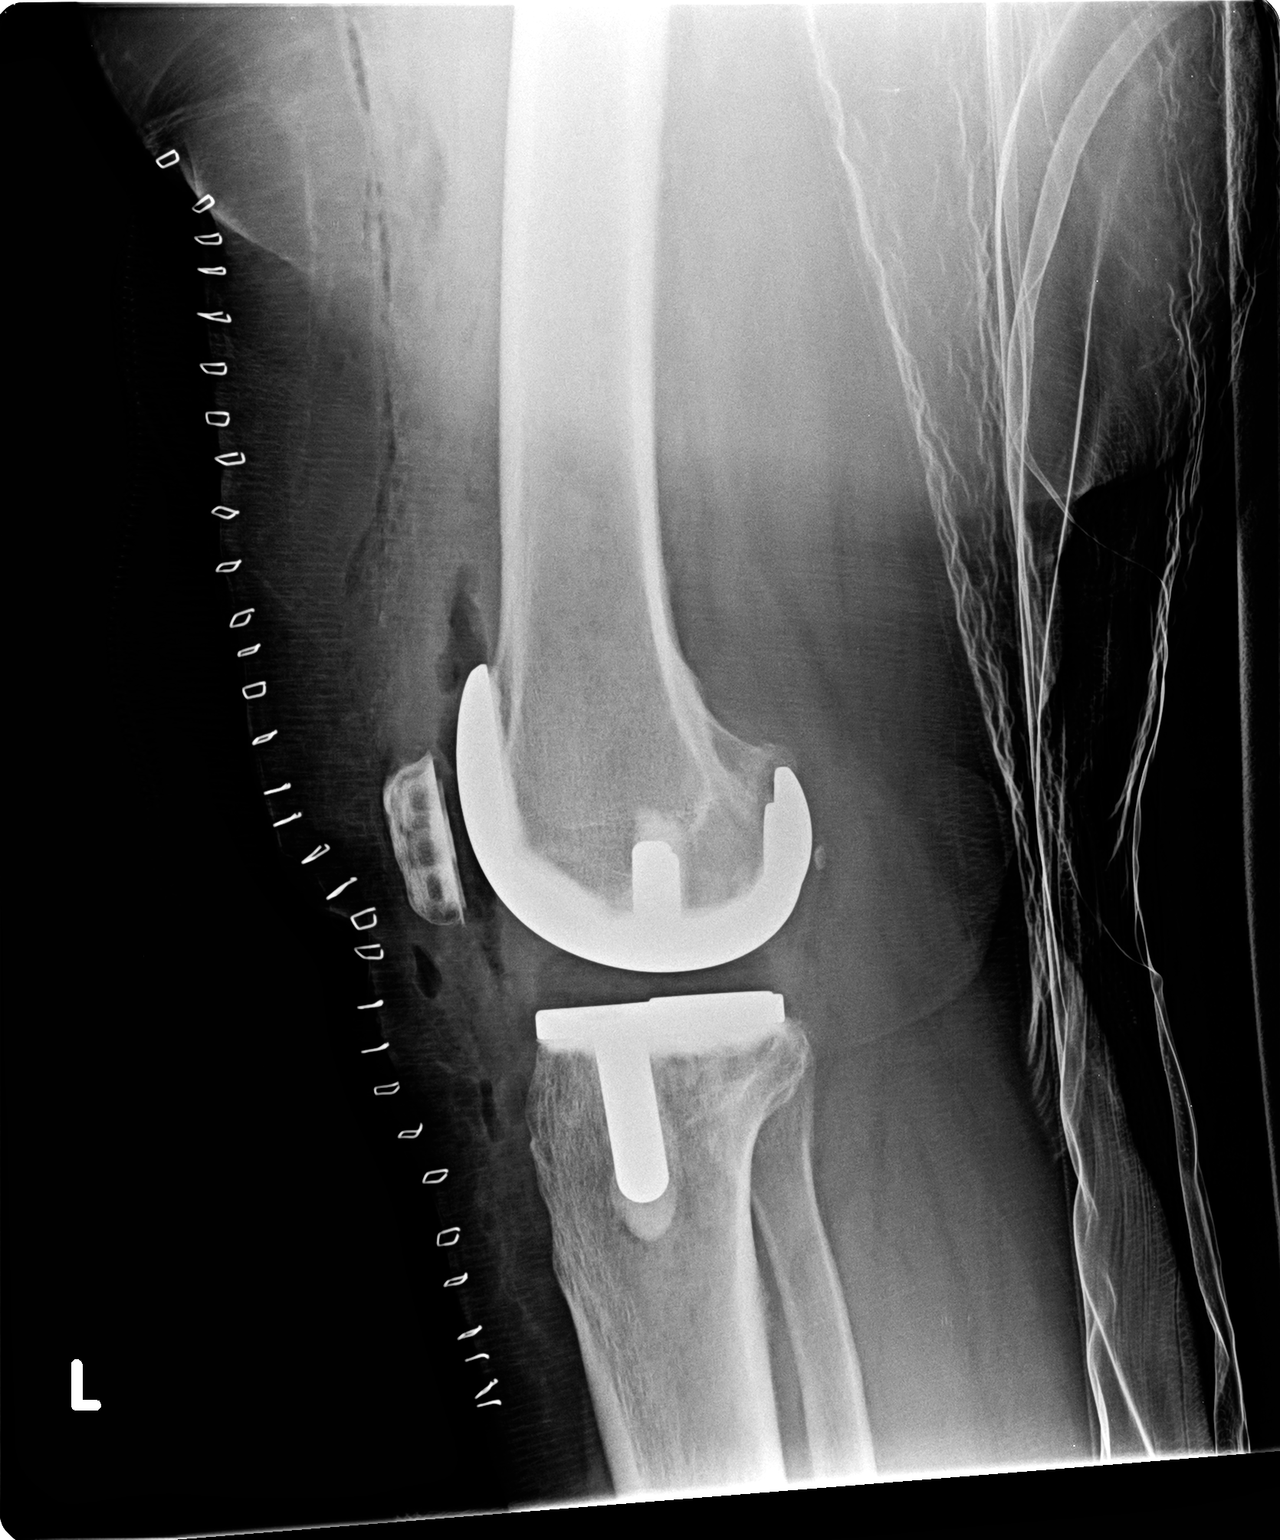

[2 of 2 positions shown; findings below may reference images not displayed]

FINDINGS

There are expected postsurgical findings of recently placed, well-aligned left total knee
arthroplasty including anterior skin staples, soft tissue gas and edema, and joint fluid. There is
no perihardware lucency or fracture.

IMPRESSION

Expected postoperative appearance of recently placed, well-aligned left total knee arthroplasty
without evidence of hardware abnormality.

## 2019-01-04 ENCOUNTER — Encounter: Admit: 2019-01-04 | Discharge: 2019-01-04

## 2019-01-04 NOTE — Telephone Encounter
Patient called requesting instruction for anticoagulation therapy for upcoming shoulder surgery scheduled 8/7.  Will review with [facility] and call pt back with instructions for holding.  Flag sent to [facility].

## 2019-01-09 NOTE — Telephone Encounter
Asked [facility] nursing to f/u with [facility] since [facility] was out of the office, but no note in chart.  Called and [facility] for patient regarding [facility] recommendations.  No further needs at this time. Asked for a return call if she has questions or concerns.       Silvestre Moment, MD  Betsy Pries, RN    She's in SR most of the time not flutter so w/ low arrhythmia burden, she's at minimal risk of forming clot and embolizing off Eliquis for a few days.   Thank s   CP

## 2019-02-13 ENCOUNTER — Ambulatory Visit: Admit: 2019-02-13 | Discharge: 2019-02-14

## 2019-02-13 DIAGNOSIS — I480 Paroxysmal atrial fibrillation: Secondary | ICD-10-CM

## 2019-02-13 DIAGNOSIS — I4819 Other persistent atrial fibrillation: Principal | ICD-10-CM

## 2019-02-14 DIAGNOSIS — I440 Atrioventricular block, first degree: Secondary | ICD-10-CM

## 2019-05-01 ENCOUNTER — Encounter: Admit: 2019-05-01 | Discharge: 2019-05-01 | Payer: MEDICARE

## 2019-05-01 DIAGNOSIS — I4819 Other persistent atrial fibrillation: Secondary | ICD-10-CM

## 2019-05-01 DIAGNOSIS — I10 Essential (primary) hypertension: Secondary | ICD-10-CM

## 2019-05-01 DIAGNOSIS — E669 Obesity, unspecified: Secondary | ICD-10-CM

## 2019-05-01 DIAGNOSIS — E7800 Pure hypercholesterolemia, unspecified: Secondary | ICD-10-CM

## 2019-05-01 DIAGNOSIS — I484 Atypical atrial flutter: Secondary | ICD-10-CM

## 2019-05-01 DIAGNOSIS — I517 Cardiomegaly: Secondary | ICD-10-CM

## 2019-05-01 DIAGNOSIS — R001 Bradycardia, unspecified: Secondary | ICD-10-CM

## 2019-05-01 DIAGNOSIS — E785 Hyperlipidemia, unspecified: Secondary | ICD-10-CM

## 2019-05-01 DIAGNOSIS — I480 Paroxysmal atrial fibrillation: Secondary | ICD-10-CM

## 2019-05-01 DIAGNOSIS — I4892 Unspecified atrial flutter: Secondary | ICD-10-CM

## 2019-05-01 NOTE — Progress Notes
Date of Service: 05/01/2019    Hannah Andrade is a 73 y.o. female.       HPI     Hannah Andrade returns for follow-up of her cardiovascular problems which have primarily been rhythm related with to atrial flutter ablations pacemaker for intermittent AV block without pacer dependence and no recurrent atrial fibrillation on pacer mediated monitoring of rhythm disturbances.  She is physically active and generally gets 10,000 steps per day.  She got a treadmill for her birthday about 7 weeks ago and its up and running.  She is using Eliquis five twice daily for stroke prevention and is generally not having bleeding problems.  She has noticed a little blood in the stool and had a colonoscopy recently which showed hemorrhoids but no polyps she has a history of colon cancer in the family and is very alert for any indication that colon cancer could be developing.  She is not having any other bleeding problems.    She has very occasional sense of palpitations but over the course of the of the twenty of 2020 she has had these very rarely and only briefly.  She has never had coronary disease and is taking statin for primary prevention.  She tolerates Zocor forty very well.  Her hypertension is well controlled on 40 mg lisinopril 25 mg hydrochlorothiazide.         Vitals:    05/01/19 0844 05/01/19 0845   BP: 124/74 136/80   BP Source: Arm, Left Upper Arm, Right Upper   Pulse: 60    Temp: 36.6 ?C (97.9 ?F)    SpO2: 98%    Weight: 78.5 kg (173 lb)    Height: 1.448 m (4' 9)    PainSc: Zero      Body mass index is 37.44 kg/m?Marland Kitchen     Past Medical History  Patient Active Problem List    Diagnosis Date Noted   ? 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 Bradly Bienenstock afpter 2nd atrial flutter ablation     ? 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 Bradycardia 11/03/2017     Chronic HR 40s - 50s on sotalol 40 BID.   Post 2019 atrial flutter ablation bradycardia. Pacer implant Dr. Bradly Bienenstock ? On continuous oral anticoagulation 08/31/2016   ? At risk for cardiovascular event: Normal cor angio 2015 08/12/2015     08/2013: Patient reports having normal coronary angiography done prior to atrial fibrillation Ablation, [facility], [facility]      ? Primary osteoarthritis of both knees 08/12/2015     05/12/15 right TKA  [facility] Dr. Aundria Rud, no problems  2018 L TKA Dr. Aundria Rud     ? Palpitations 09/02/2014   ? First degree AV block 03/19/2014     03/18/14--PR interval 440  MAC OV, increased from 340 in July on Sotalol 40 BID, Sotalol DC'd      ? S/P ablation of atrial fibrillation 01/01/2014     08/14/13 [facility] [place] Dr. Hilarie Fredrickson     ? Atrial flutter (HCC) 12/27/2013     06/2013 Paroxysmal atrial flutter diagnosed in ER  09/03/13 Cryoablation for atrial fib via PVI x 4 &.  RF ablation of typical cavotricuspid isthmus atrial flutter. [facility], [facility]  10/15 Fatigue & Bradycardia w/ Sotalol,DC'd. Notes increase of some palpitations but cessation of fatigue and bradycardia  09/2017 Recurrent symptomatic atrial flutter, ablated Dr. Yetta Numbers, post ablation brady>>DDD Pacer implan     ?  LAE (left atrial enlargement) 12/27/2013   ? Obesity 12/27/2013   ? Hypertension 12/26/2013     7/15 add HCTZ MAC Clinic Echo 01/2014:  EF 65% Walls 1.1 c.w mild LVH.          ? Atrial fibrillation (HCC) 12/26/2013     08/14/2013: PVI for AF persistingw/ HR to 40s on Sotallol 40 BID @ [facility], Dr. Hilarie Fredrickson same setting as atrial flutter  Ablation, continues .sotalol,  Sotalol DC'd due to malainse, fatigue  March 2016 Ziopatch 2 week monitor off sotalol  showed no atrial fibrillation with  sinus rhythm during some palpitations.      ? Hyperlipemia 12/26/2013     08/2013 total 167, trig 58 HDL 67 LDL 89 on Simva 40           Review of Systems   Constitution: Negative.   HENT: Negative.    Eyes: Negative.    Cardiovascular: Positive for palpitations. Respiratory: Negative.    Endocrine: Negative.    Hematologic/Lymphatic: Negative.    Skin: Negative.    Musculoskeletal: Positive for arthritis.   Gastrointestinal: Negative.    Genitourinary: Negative.    Neurological: Negative.    Psychiatric/Behavioral: Negative.    Allergic/Immunologic: Negative.    14 organ system review noted. It is negative except as reported in current narrative or above in the ROS section. This is a patient centered review of systems that was stated by the patient in her terms prior to my personal problem oriented interview with the patient    Physical Exam  General: Patient in no distress, looks generally healthy. Skin warm and dry, non icteric, Appearance consistent with calculated BMI of 39. Wearing medical face mask    Mucous membranes moist. Pupils equal and round Carotids: no bruits Thyroid not enlarged.  Neck veins: CVP <6 normal, no V wave, no HJR   Respiratory: Breathing comfortably. Lungs clear to percussion & auscultation. No rales, rhonchi or wheezing   Thorax: Left distal subclavicular pacing device well-positioned with a healed incision and no inflammation or tenderness.    Cardiac: Regular rhythm. LV impulse not palpable. Normal S1 & S2, Fourth heart sound, no rub or S3. No murmur.   Abdomen: soft, non-tender, no masses,bruits,hepatic or aortic enlargement. + bowel sounds.   Femoral arteries: Good pulses, no bruits. Legs/feet: Normal PT pulses, no edema.   Motor: Normal muscle strength. Cognitive: Pleasant demeanor. Good insight. No depression     Cardiovascular Studies  Today's 12 lead EKG: AV paced rhythm, rate 60  Reviewed her remote pacemaker interrogations in seen no evidence of recurrent atrial fibrillation or flutter.  She has over 12years battery life projected.    Problems Addressed Today  No diagnosis found.    Assessment and Plan Hannah Andrade is doing extremely well.  She has excellent exercise program.  She has no symptoms of angina with minimal palpitations that I do not think represent any significant arrhythmia.  She is tolerating Eliquis well.  She does not need any type of ambulatory monitor given her minimal symptoms and favorable signals from the pacer.    I reviewed her echo findings at the end of the evaluation just to make sure I had not overlooked something.  Indeed she did have a slight decline in ejection fraction in the low normal range in 2019 at the time of her flutter reablation.  I think would be very reasonable to recheck an echo just to make sure her heart function has not  deteriorated further without clinical evidence of that problem.  She can get an echo without Doppler appear in [place] and I will review it when reporteed.     Her blood pressure looks good with readings under 130/80.    Her glucose was 107 earlier this year.  She is follows with her primary care physician for diabetes monitoring but right now I do not think carries that diagnosis.    I do not think she needs regular follow-up more often than annually unless her symptoms change in someway that requires prompt evaluation.  She has a remote monitor transmitter at home and will call if anything changes that requires cardiology attention.    I spent over 25 mintues with Mrs. Shreve toda with the majority of that time outlining her current status, prognosis and treatment options.     NB: The free text in this document was generated through Dragon(TM) software with editing and proofreading  done by the author of this document Dr. Mable Paris MD, [facility] principally at the point of care. Some errors may persist.  If there are questions about content in this document please contact Dr. Hale Bogus.    The written information I provided Ms. Zamor at the conclusion of today's encounter is as  follows:   Patient Instructions As as you heard when I just dictated my formal report everything looks good.  Your exercise program is excellent with 10,000 steps a day being your goal.    Your blood pressure looks good 130/80 or less is your goal.    Your bad cholesterol is under seventy where the main goal you would have is being under 100.  The lower the better so stay with the Zocor at the current dose.    Staying with the Eliquis for stroke prevention is smart.  Trying to rely on your pacer to tell you when you need to restart Eliquis if you have atrial fib or flutter is a little more risky and is a strategy I would use only if you are having bleeding problems with the Eliquis.    The only lab number that looked a little concerning was your glucose/blood sugar which was 107 earlier this year.  I do not think that is anything to worry about but technically it is in the prediabetes category.    As you saw I noticed your echo reports just as I was wrapping up the office visit and realize that your ejection fraction was a little bit less on your echoes around the time of your flutter ablation than had been the case on your earlier echo.  I think the chances are 9 out of 10 that your heart is just stronger stronger now than it was a year and a half ago but I would rather have the facts.     I will think I need to see you more often than annually something changes..                 Current Medications (including today's revisions)  ? alendronate (FOSAMAX) 70 mg tablet Take 1 tablet by mouth every 7 days.   ? apixaban (ELIQUIS) 5 mg tablet Take 1 tablet by mouth twice daily.   ? hydrochlorothiazide (HYDRODIURIL) 25 mg tablet Take 1 Tab by mouth daily.   ? lisinopril (PRINIVIL, ZESTRIL) 40 mg tablet Take 1 tablet by mouth daily.   ? simvastatin (ZOCOR) 40 mg tablet Take 40 mg by mouth at bedtime daily.   ? vitamins, multiple  cap Take 1 Cap by mouth daily.

## 2019-05-08 ENCOUNTER — Encounter: Admit: 2019-05-08 | Discharge: 2019-05-08 | Payer: MEDICARE

## 2019-05-08 ENCOUNTER — Ambulatory Visit: Admit: 2019-05-08 | Discharge: 2019-05-08 | Payer: MEDICARE

## 2019-05-08 DIAGNOSIS — I4819 Other persistent atrial fibrillation: Secondary | ICD-10-CM

## 2019-05-08 DIAGNOSIS — I10 Essential (primary) hypertension: Secondary | ICD-10-CM

## 2019-05-08 DIAGNOSIS — I484 Atypical atrial flutter: Secondary | ICD-10-CM

## 2019-08-09 ENCOUNTER — Encounter: Admit: 2019-08-09 | Discharge: 2019-08-09 | Payer: MEDICARE

## 2019-08-09 NOTE — Progress Notes
CT Chest dated 05/23/2019 received from from outside provider. This was completed from the recommendations made on CT LMTD Chest dated 02/21/2018. Results reviewed and scanned into chart. Closed loop imaging completed.

## 2019-08-13 ENCOUNTER — Ambulatory Visit: Admit: 2019-08-13 | Discharge: 2019-08-13 | Payer: MEDICARE

## 2019-08-13 DIAGNOSIS — I480 Paroxysmal atrial fibrillation: Secondary | ICD-10-CM

## 2019-08-13 DIAGNOSIS — I4819 Other persistent atrial fibrillation: Secondary | ICD-10-CM

## 2019-08-13 DIAGNOSIS — I440 Atrioventricular block, first degree: Secondary | ICD-10-CM

## 2019-11-13 ENCOUNTER — Encounter: Admit: 2019-11-13 | Discharge: 2019-11-13 | Payer: MEDICARE

## 2019-11-13 DIAGNOSIS — Z950 Presence of cardiac pacemaker: Secondary | ICD-10-CM

## 2019-11-13 DIAGNOSIS — R001 Bradycardia, unspecified: Secondary | ICD-10-CM

## 2020-02-06 ENCOUNTER — Encounter: Admit: 2020-02-06 | Discharge: 2020-02-07 | Payer: MEDICARE

## 2020-02-11 ENCOUNTER — Encounter: Admit: 2020-02-11 | Discharge: 2020-02-11 | Payer: MEDICARE

## 2020-03-04 ENCOUNTER — Ambulatory Visit: Admit: 2020-03-04 | Discharge: 2020-03-04 | Payer: MEDICARE

## 2020-03-04 ENCOUNTER — Encounter: Admit: 2020-03-04 | Discharge: 2020-03-04 | Payer: MEDICARE

## 2020-03-04 DIAGNOSIS — Z950 Presence of cardiac pacemaker: Secondary | ICD-10-CM

## 2020-03-04 DIAGNOSIS — R001 Bradycardia, unspecified: Secondary | ICD-10-CM

## 2020-03-05 ENCOUNTER — Encounter: Admit: 2020-03-05 | Discharge: 2020-03-05 | Payer: MEDICARE

## 2020-04-17 ENCOUNTER — Encounter: Admit: 2020-04-17 | Discharge: 2020-04-17 | Payer: MEDICARE

## 2020-04-17 NOTE — Progress Notes
Records Request    Takelia Urieta DOB: Sep 02, 1945    STAT    Medical records request for continuation of care:    Patient has appointment on 04/29/20  with Dr. Hale Bogus.    Please fax records to Cardiovascular Medicine [place] [facility] [phone]    Request records:    Recent Labs    Thank you,      Cardiovascular Medicine  [facility]  [address]  [place], [place] [address]  Phone:  [phone]  Fax:  [phone]

## 2020-04-22 ENCOUNTER — Encounter: Admit: 2020-04-22 | Discharge: 2020-04-22 | Payer: MEDICARE

## 2020-04-29 ENCOUNTER — Encounter: Admit: 2020-04-29 | Discharge: 2020-04-29 | Payer: MEDICARE

## 2020-04-29 DIAGNOSIS — I4819 Other persistent atrial fibrillation: Secondary | ICD-10-CM

## 2020-04-29 DIAGNOSIS — I10 Essential (primary) hypertension: Secondary | ICD-10-CM

## 2020-04-29 DIAGNOSIS — E7800 Pure hypercholesterolemia, unspecified: Secondary | ICD-10-CM

## 2020-04-29 DIAGNOSIS — Z950 Presence of cardiac pacemaker: Secondary | ICD-10-CM

## 2020-04-29 DIAGNOSIS — R001 Bradycardia, unspecified: Secondary | ICD-10-CM

## 2020-04-29 DIAGNOSIS — I517 Cardiomegaly: Secondary | ICD-10-CM

## 2020-04-29 DIAGNOSIS — E785 Hyperlipidemia, unspecified: Secondary | ICD-10-CM

## 2020-04-29 DIAGNOSIS — Z7901 Long term (current) use of anticoagulants: Secondary | ICD-10-CM

## 2020-04-29 DIAGNOSIS — I480 Paroxysmal atrial fibrillation: Secondary | ICD-10-CM

## 2020-04-29 DIAGNOSIS — E669 Obesity, unspecified: Secondary | ICD-10-CM

## 2020-04-29 DIAGNOSIS — Z9889 Other specified postprocedural states: Secondary | ICD-10-CM

## 2020-04-29 DIAGNOSIS — I4892 Unspecified atrial flutter: Secondary | ICD-10-CM

## 2020-04-29 DIAGNOSIS — I484 Atypical atrial flutter: Secondary | ICD-10-CM

## 2020-04-29 DIAGNOSIS — R002 Palpitations: Secondary | ICD-10-CM

## 2020-04-29 NOTE — Patient Instructions
Your blood pressure is borderline high.  I believe that we should consider this elevated because your heart walls on your last 3 echoes have shown to shown [place] thickening which comes from high blood pressure.    The first option would be to start a third blood pressure pill.  The second option is for you to get your blood pressure cuff validated with Dr. Garnette Scheuermann office blood pressure cuff so you know you can believe it cut the salt out of your diet fairly dramatically both by not salting food at the table and working with your husband to reduce the salt in your cooking.  I would like you to stop thinking food is bland and start thinking salt restriction is grand.    Walking is not a great tool for reducing blood pressure.  If you get started on that weight loss targeting 150 pounds that could knock another 5 or 10 points off your blood pressure.  This with salt restriction and calorie restriction you could avoid the need to add another medicine for your blood pressure control.  Healthy blood pressure is 130/80 or less.    Everything else looks good.  Your bad cholesterol was under 100 on the last reading.  You are due for another reading in January.  Personally speaking I let my patients skip cholesterol readings between Thanksgiving and the Super Bowl and wait till he can get back on a good nonholiday diet.    You are having palpitations only very rarely.  I do not think we need to worry about anything else regarding heart rhythms.  It is very tricky to be certain it safe to come off anticoagulants because stroke prevention is so important and it is hard to know that you are not having some atrial fib or flutter that you do not know about.    Return to see me for a recheck in one year at [place].    I can see you sooner if needed.   Call in if you have problems or questions.   Marissa Nestle, MD

## 2020-04-29 NOTE — Progress Notes
Date of Service: 04/29/2020    Hannah Andrade is a 74 y.o. female.       HPI     Hannah Andrade presents for evaluation of her atrial arrhythmias after flutter ablation, pacemaker after severe bradycardia after her second flutter ablation hypertension and hyperlipidemia.  She had her initial ablation in [place] and second subsequently had her second ablation by Dr. Bradly Bienenstock here when she had recurrence subsequent to her moved to [place].  She remains on Eliquis for stroke prevention after the ablations.  Based on her age weight and creatinine for appropriate dose for apixaban is 5 twice daily which is what she is taking.  Her cholesterol was satisfactory last visit.     She is here with her husband.  Both of them report that she is doing well.  She is having no angina she had no tachypalpitations.  Her energy level is good.  She is not avoiding any activities due to shortness of breath triggering triggering of palpitations upper body distress or dizziness.  She is tolerating her medications well.  She is not checking her pressure very often.  She was not that surprised by the readings obtained today in clinic.         Vitals:    04/29/20 1016 04/29/20 1029   BP: (!) 142/78 (!) 140/80   BP Source: Arm, Left Upper Arm, Right Upper   Patient Position: Sitting Sitting   Pulse: 61    SpO2: 97%    Weight: 78.7 kg (173 lb 6.4 oz)    Height: 1.448 m (4' 9)    PainSc: Zero      Body mass index is 37.52 kg/m?Marland Kitchen     Past Medical History  Patient Active Problem List    Diagnosis Date Noted   ? 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 Bradly Bienenstock afpter 2nd atrial flutter ablation     ? 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 Bradycardia 11/03/2017     Chronic HR 40s - 50s on sotalol 40 BID.   Post 2019 atrial flutter ablation bradycardia. Pacer implant Dr. Bradly Bienenstock     ? On continuous oral anticoagulation 08/31/2016   ? At risk for cardiovascular event: Normal cor angio 2015 08/12/2015     08/2013: Patient reports having normal coronary angiography done prior to atrial fibrillation Ablation, [facility].      ? Primary osteoarthritis of both knees 08/12/2015     05/12/15 right TKA  [facility] Dr. Aundria Rud, no problems  2018 L TKA Dr. Aundria Rud     ? Palpitations 09/02/2014   ? First degree AV block 03/19/2014     03/18/14--PR interval 440  MAC OV, increased from 340 in July on Sotalol 40 BID, Sotalol DC'd      ? S/P ablation of atrial fibrillation 01/01/2014     08/14/13 [facility] [place], Dr. Hilarie Fredrickson     ? Atrial flutter (HCC) 12/27/2013     06/2013 Paroxysmal atrial flutter diagnosed in ER  09/03/13 Cryoablation for atrial fib via PVI x 4 &.  RF ablation of typical cavotricuspid isthmus atrial flutter. [facility], [facility]  10/15 Fatigue & Bradycardia w/ Sotalol,DC'd. Notes increase of some palpitations but cessation of fatigue and bradycardia  09/2017 Recurrent symptomatic atrial flutter, ablated Dr. Yetta Numbers, post ablation brady>>DDD Pacer implan     ? LAE (left atrial enlargement) 12/27/2013   ? Obesity 12/27/2013   ?  Hypertension 12/26/2013     7/15 add HCTZ MAC Clinic Echo 01/2014:  EF 65% Walls 1.1 c.w mild LVH.          ? Atrial fibrillation (HCC) 12/26/2013     08/14/2013: PVI for AF persistingw/ HR to 40s on Sotallol 40 BID @ [facility], Dr. Hilarie Fredrickson same setting as atrial flutter  Ablation, continues .sotalol,  Sotalol DC'd due to malainse, fatigue  March 2016 Ziopatch 2 week monitor off sotalol  showed no atrial fibrillation with  sinus rhythm during some palpitations.      ? Hyperlipemia 12/26/2013     08/2013 total 167, trig 58 HDL 67 LDL 89 on Simva 40           Review of Systems   Constitutional: Negative.   HENT: Negative.    Eyes: Negative.    Cardiovascular: Positive for palpitations.   Respiratory: Negative.    Endocrine: Negative.    Hematologic/Lymphatic: Negative.    Skin: Negative.    Musculoskeletal: Negative. Gastrointestinal: Negative.    Genitourinary: Negative.    Neurological: Negative.    Psychiatric/Behavioral: Negative.    Allergic/Immunologic: Negative.        Physical Exam  General: Patient in no distress, looks generally healthy. Skin warm and dry, non icteric, Appearance consistent with calculated BMI of 39. Wearing medical face mask    Mucous membranes moist. Pupils equal and round Carotids: no bruits Thyroid not enlarged.  Neck veins: CVP <6 normal, no V wave, no HJR   Respiratory: Breathing comfortably. Lungs clear to percussion & auscultation. No rales, rhonchi or wheezing   Thorax: Left distal subclavicular pacing device well-positioned with a healed incision and no inflammation or tenderness.    Cardiac: Regular rhythm. LV impulse not palpable. Normal S1 & S2, Fourth heart sound, no rub or S3. No murmur.   Abdomen: soft, non-tender, no masses,bruits,hepatic or aortic enlargement. + bowel sounds.   Femoral arteries: Good pulses, no bruits. Legs/feet: Normal PT pulses, no edema.   Motor: Normal muscle strength. Cognitive: Pleasant demeanor. Good insight. No depression     Cardiovascular Studies  Today's 12 lead EKG: AV paced rhythm, rate 60, no ectopy    Cardiovascular Health Factors  Vitals BP Readings from Last 3 Encounters:   04/29/20 (!) 140/80   05/08/19 (!) 148/70   05/01/19 136/80     Wt Readings from Last 3 Encounters:   04/29/20 78.7 kg (173 lb 6.4 oz)   05/08/19 78.9 kg (174 lb)   05/01/19 78.5 kg (173 lb)     BMI Readings from Last 3 Encounters:   04/29/20 37.52 kg/m?   05/08/19 37.65 kg/m?   05/01/19 37.44 kg/m?      Smoking Social History     Tobacco Use   Smoking Status Former Smoker   ? Years: 20.00   ? Types: Cigarettes   ? Quit date: 01/01/1984   ? Years since quitting: 36.3   Smokeless Tobacco Never Used      Lipid Profile Cholesterol   Date Value Ref Range Status   06/22/2019 184  Final     HDL   Date Value Ref Range Status   06/22/2019 77  Final     LDL   Date Value Ref Range Status 06/22/2019 98  Final     Triglycerides   Date Value Ref Range Status   06/22/2019 45  Final      Blood Sugar Hemoglobin A1C   Date Value Ref Range  Status   06/27/2018 6.0 (H) <5.7 Final     Glucose   Date Value Ref Range Status   06/22/2019 95  Final   06/27/2018 107  Final   11/04/2017 101 (H) 70 - 100 MG/DL Final          Problems Addressed Today  Encounter Diagnoses   Name Primary?   ? Persistent atrial fibrillation (HCC)    ? Atypical atrial flutter (HCC)    ? Essential hypertension        Assessment and Plan      Hannah Andrade has significant as concerning elevations of blood pressure with my concerns about hypertension compounded by increased wall thickness seen on her last echoes.  She is resistant to starting more medication at this time.  Since her blood pressure readings are not profoundly elevated I I did not press her to start antihypertensive augmentation now.  She is going to immediately restricts salt in her diet.  She is not that rigorous about this.  Her husband does most of the cooking.  He is not that rigorous and limiting salt in the diet as he is preparing the food either.  They have valid to reduce the salt she will be getting in her food.  She is going to get her blood pressure cuff validated by Dr.Brockert office cuff and start checking pressures regularly.  She understands that with salt restriction she still may have readings significantly over 130/80 and will report to [place] if she does.  In that case she will need a third antihypertensive as she is on solid dose lisinopril 40 and HCTZ 25.  I suspect amlodipine will be my next choice 2.5 to 5 mg depending on her readings.    I outlined the goal of weight loss as another means of reducing need for antihypertensives.  I set a goal for her of about 150 which would be mean and 23 pound weight loss.  Told her that waiting for the blood weight to go down is none acceptable strategy if her blood pressures are clearly too high and that we will need to start more medication and see what happens if she does lose weight.    I urged her to establish a regular exercise program of 3 hours a week with out the expectation that this will drop pressure.  Should reduce her risk of some cardiovascular complications as well as colon cancer long-term.    She describes palpitations only rarely.  These are very brief and minimally noticeable.  I do not believe she needs ambulatory rhythm monitor as this does not sound at all like the intensity of symptoms she had when she had atrial fib.  She will call if she has more palpitations and we will obtain a rhythm monitor.    Her LDL was under 100.  She will have a lipid profile after the first of the year.    I do not think she needs to be seen in cardiology clinic by me for a year.  She expressed interest in coming to [place] see me in follow-up after I concluded my [employer] clinic practice following my December 2021 clinic in [place].    40 minutes were expended as the total time for this encounter.  The time was spent reviewing records,  interviewing patient, doing exam, developing diagnosis, creating treatment plan written in patient oriented  terminology  for the AVS, explaining it to the patient and entering further information in the EMR.  NB: The free text in this document was generated through Dragon(TM) software with editing and proofreading  done by the author of this document Dr. Mable Paris MD, [facility] principally at the point of care. Some errors may persist.  If there are questions about content in this document please contact Dr. Hale Bogus.    The written information I provided Hannah Andrade at the conclusion of today's encounter is as  follows:    Patient Instructions   Your blood pressure is borderline high.  I believe that we should consider this elevated because your heart walls on your last 3 echoes have shown to shown [place] thickening which comes from high blood pressure.    The first option would be to start a third blood pressure pill.  The second option is for you to get your blood pressure cuff validated with Dr. Garnette Scheuermann office blood pressure cuff so you know you can believe it cut the salt out of your diet fairly dramatically both by not salting food at the table and working with your husband to reduce the salt in your cooking.  I would like you to stop thinking food is bland and start thinking salt restriction is grand.    Walking is not a great tool for reducing blood pressure.  If you get started on that weight loss targeting 150 pounds that could knock another 5 or 10 points off your blood pressure.  This with salt restriction and calorie restriction you could avoid the need to add another medicine for your blood pressure control.  Healthy blood pressure is 130/80 or less.    Everything else looks good.  Your bad cholesterol was under 100 on the last reading.  You are due for another reading in January.  Personally speaking I let my patients skip cholesterol readings between Thanksgiving and the Super Bowl and wait till he can get back on a good nonholiday diet.    You are having palpitations only very rarely.  I do not think we need to worry about anything else regarding heart rhythms.  It is very tricky to be certain it safe to come off anticoagulants because stroke prevention is so important and it is hard to know that you are not having some atrial fib or flutter that you do not know about.    Return to see me for a recheck in one year at [place].    I can see you sooner if needed.   Call in if you have problems or questions.   Marissa Nestle, MD                    Current Medications (including today's revisions)  ? alendronate (FOSAMAX) 70 mg tablet Take 1 tablet by mouth every 7 days.   ? apixaban (ELIQUIS) 5 mg tablet Take 1 tablet by mouth twice daily.   ? hydrochlorothiazide (HYDRODIURIL) 25 mg tablet Take 1 Tab by mouth daily.   ? lisinopril (PRINIVIL, ZESTRIL) 40 mg tablet Take 1 tablet by mouth daily. ? simvastatin (ZOCOR) 40 mg tablet Take 40 mg by mouth at bedtime daily.   ? vitamins, multiple cap Take 1 Cap by mouth daily.

## 2020-05-12 ENCOUNTER — Encounter: Admit: 2020-05-12 | Discharge: 2020-05-12 | Payer: MEDICARE

## 2020-08-12 ENCOUNTER — Encounter: Admit: 2020-08-12 | Discharge: 2020-08-12 | Payer: MEDICARE

## 2020-08-13 ENCOUNTER — Encounter: Admit: 2020-08-13 | Discharge: 2020-08-13 | Payer: MEDICARE

## 2020-08-13 DIAGNOSIS — R001 Bradycardia, unspecified: Secondary | ICD-10-CM

## 2020-08-13 DIAGNOSIS — I4892 Unspecified atrial flutter: Secondary | ICD-10-CM

## 2020-08-13 DIAGNOSIS — I4819 Other persistent atrial fibrillation: Secondary | ICD-10-CM

## 2021-02-15 ENCOUNTER — Encounter: Admit: 2021-02-15 | Discharge: 2021-02-15 | Payer: MEDICARE

## 2021-02-15 DIAGNOSIS — Z950 Presence of cardiac pacemaker: Secondary | ICD-10-CM

## 2021-02-27 IMAGING — CR CHEST
1 series · 1 of 1 positions shown · non-contrast
Comparison: none

[shoulder grashey]
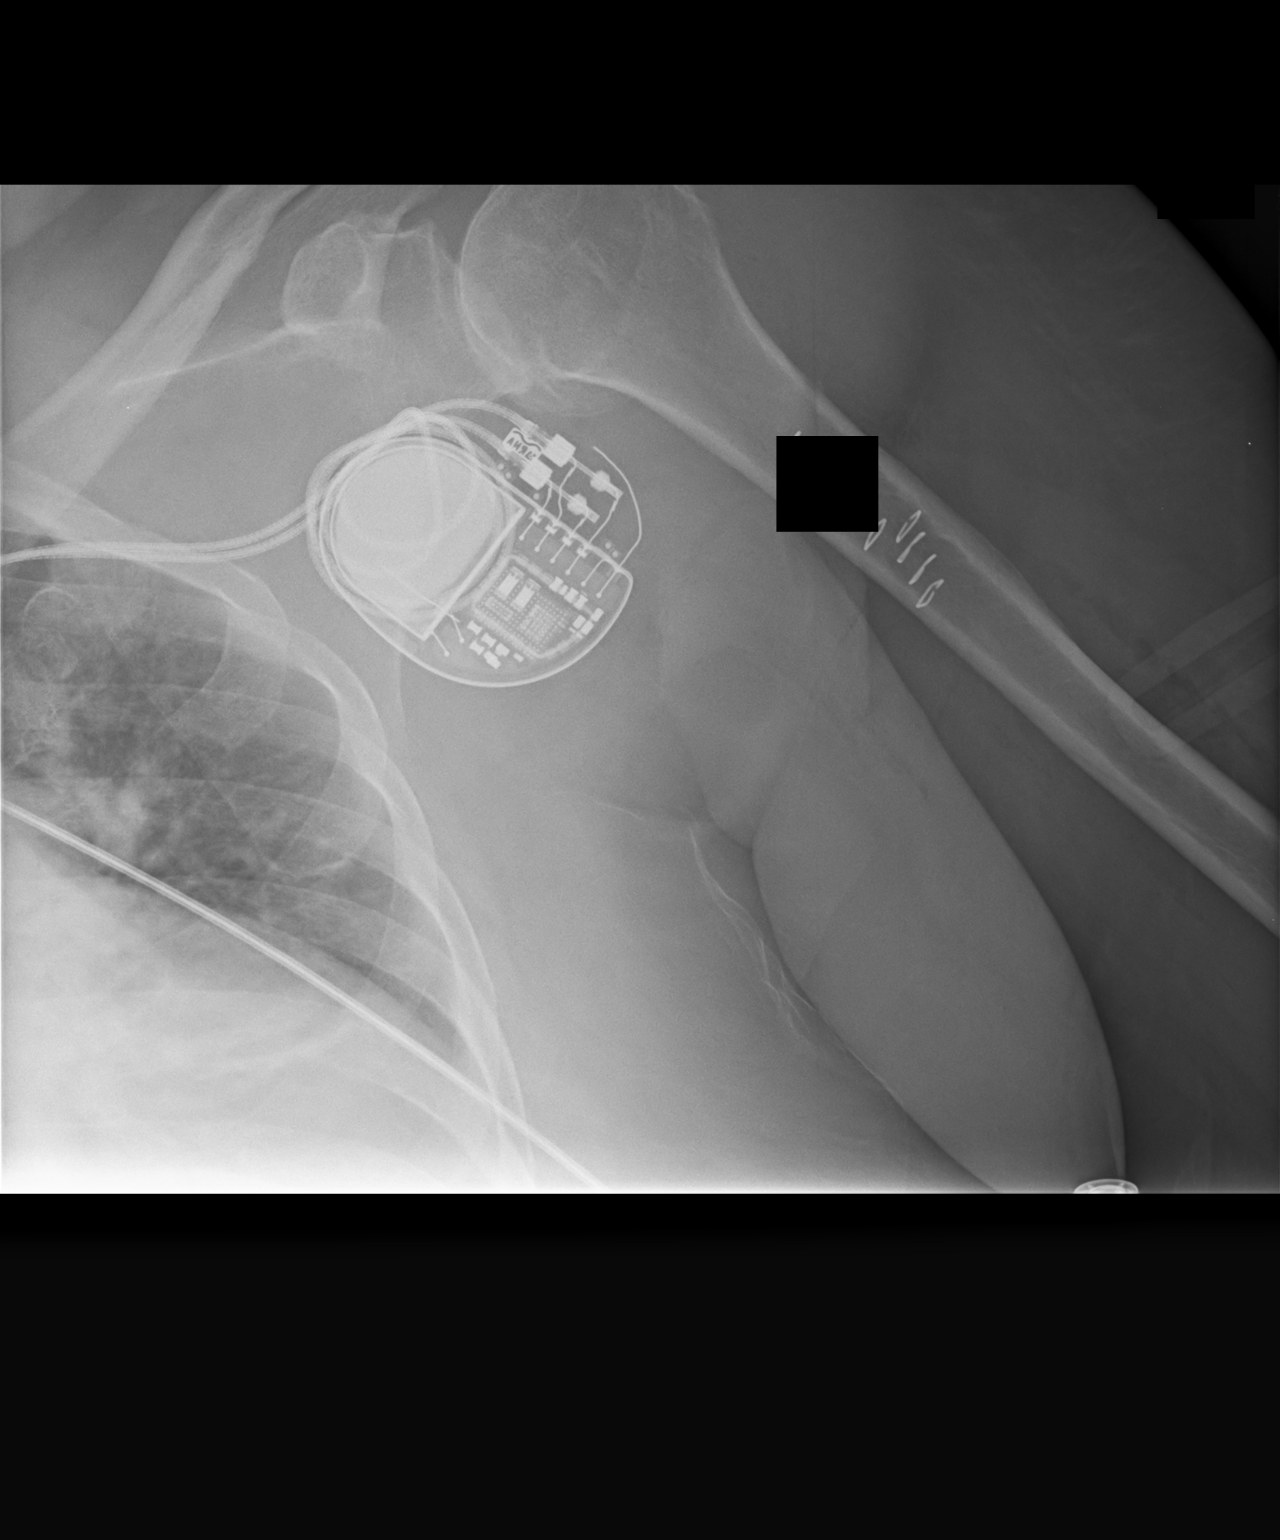

[1 of 1 positions shown; findings below may reference images not displayed]

Primary:     HOLLANDER, RUBENIA                                                        Date: 08/

DIAGNOSTIC STUDIES

EXAM

Left shoulder

INDICATION

postop images
POST OP. JC/TM

TECHNIQUE

Single AP view left shoulder was obtained.

COMPARISONS

None available

FINDINGS

Pacemaker is partially imaged. Surgical skin staples overlie the upper left humerus. There are
degenerative changes of glenohumeral joint space and AC joint. No fractures are seen.

IMPRESSION

Single postop film demonstrates interval removal of large calcific density projecting adjacent to
the humeral head on outside films.

Tech Notes:

POST OP. JC/TM

## 2021-04-21 ENCOUNTER — Encounter: Admit: 2021-04-21 | Discharge: 2021-04-21 | Payer: MEDICARE

## 2021-04-21 NOTE — Telephone Encounter
Hannah Sago, RN reviewed with RCP. Per RCP patient to f/u with Dr Hale Bogus.   RN sent a message to Dr. Francee Nodal team notifying them of patient's recent hospital visit and requesting to move patient's apt with Dr. Hale Bogus up.

## 2021-04-21 NOTE — Telephone Encounter
RC to patient. She states she started to have heart palpitation yesterday afternoon. Her PCP told her to go to the ER. Patient is currently admitted at [facility]. Patient is going to call us when she gets discharged to schedule an OV with RCP. Reviewed plan with the patient. Patient verbalized understanding and does not have any further questions or concerns. No further education requested from patient. Patient has our contact information for future needs.

## 2021-04-21 NOTE — Telephone Encounter
-----   Message from Golda Acre, LPN sent at [id number]/89/3734  4:03 PM CST -----  Regarding: RCP- having symptoms and scheduling cannot get her in till March. Call her at #[phone].

## 2021-04-21 NOTE — Progress Notes
Request for the following medical records, for the purpose Continuity of Care.     Please send the following:      Most Recent EKG's from recent admission      Please Fax to:   FAX#: [phone]  Attn: Cordelia Pen

## 2021-04-23 ENCOUNTER — Encounter: Admit: 2021-04-23 | Discharge: 2021-04-23 | Payer: MEDICARE

## 2021-04-23 NOTE — Telephone Encounter
Patient called and left message asking for appointment with Dr. Hale Bogus prior to 06/10/21.  I returned her call.  No answer so I left detailed message.  I explained Dr. Hale Bogus does not have any openings prior to 06/10/2021.  Explained I will put her on waiting list if we have any cancellations.  I also explained that she can call back to schedule an appointment with one of the nurse practitioners.  They will be able to see her sooner than 06/10/21.  I left the call back number for scheduling [phone]

## 2021-05-11 ENCOUNTER — Encounter: Admit: 2021-05-11 | Discharge: 2021-05-11 | Payer: MEDICARE

## 2021-06-10 ENCOUNTER — Encounter: Admit: 2021-06-10 | Discharge: 2021-06-10 | Payer: MEDICARE

## 2021-06-10 ENCOUNTER — Ambulatory Visit: Admit: 2021-06-10 | Discharge: 2021-06-10 | Payer: MEDICARE

## 2021-06-10 DIAGNOSIS — I10 Essential (primary) hypertension: Secondary | ICD-10-CM

## 2021-06-10 DIAGNOSIS — I4819 Other persistent atrial fibrillation: Secondary | ICD-10-CM

## 2021-06-10 DIAGNOSIS — Z7901 Long term (current) use of anticoagulants: Secondary | ICD-10-CM

## 2021-06-10 DIAGNOSIS — I4892 Unspecified atrial flutter: Secondary | ICD-10-CM

## 2021-06-10 DIAGNOSIS — E7800 Pure hypercholesterolemia, unspecified: Secondary | ICD-10-CM

## 2021-06-10 DIAGNOSIS — Z136 Encounter for screening for cardiovascular disorders: Secondary | ICD-10-CM

## 2021-06-10 DIAGNOSIS — R001 Bradycardia, unspecified: Secondary | ICD-10-CM

## 2021-06-10 NOTE — Progress Notes
Date of Service: 06/10/2021    Hannah Andrade is a 76 y.o. female.       HPI     Hannah Andrade returns for follow-up of prior atrial fibrillation.  Since last visit she has done well with no suggestion of break through palpitations or new medication intolerances.  2 AF ablations DDD pacer after 2nd ablation by Dr. Bradly Bienenstock.   She had nomal coronary angiography in 2015 when she lived in [facility].     She had a scare in November when she had sustained palpitations without blackout or shortness of breath just uncomfortable with a rapid pounding of her heart.  In the [facility] she was not found to have anything abnormal on her heart rhythm upon presentation.  She was kept overnight with no abnormalities detected.      She did not have an outpatient ambulatory monitoring.  She has not had any recurrences of those symptoms.  She was not doing anything in particular when this started.  She has not cut back on any of her activities of daily living for fear of triggering further arrhythmias.  Its just a single days episode that came and went without repercussion or recurrence.  Her blood pressure was high initially in the emergency room but after she settled down the readings were fine.  She is not had any new problems develop since I seen her last.  Her cardiac medications are stable including simvastatin for primary prevention of coronary disease lisinopril 40 and HCTZ 25 for blood pressure control and Eliquis 5 twice daily for stroke prevention because she is at risk for further atrial fibrillation and attendant stroke.     Her last lipid profile from January 2021 shows LDL below 100 which is marginally satisfactory given its her lack of prior coronary disease.  Fundamentally however and more comfortable with the reading closer to 70   06/22/19 00:00   Cholesterol 184 (E)   Triglycerides 45 (E)   HDL 77 (E)   LDL 98 (E)            Vitals:    06/10/21 1457   BP: 128/70   BP Source: Arm, Left Upper Pulse: 60   PainSc: Zero   Weight: 79.4 kg (175 lb)   Height: 144.8 cm (4' 9.01)     Body mass index is 37.86 kg/m?Marland Kitchen     Past Medical History  Patient Active Problem List    Diagnosis Date Noted   ? 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 Bradly Bienenstock afpter 2nd atrial flutter ablation     ? 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 Bradycardia 11/03/2017     Chronic HR 40s - 50s on sotalol 40 BID.   Post 2019 atrial flutter ablation bradycardia. Pacer implant Dr. Bradly Bienenstock     ? On continuous oral anticoagulation 08/31/2016   ? At risk for cardiovascular event: Normal cor angio 2015 08/12/2015     08/2013: Patient reports having normal coronary angiography done prior to atrial fibrillation Ablation, [facility], [facility].      ? Primary osteoarthritis of both knees 08/12/2015     05/12/15 right TKA  [facility] Dr. Aundria Rud, no problems  2018 L TKA Dr. Aundria Rud     ? Palpitations 09/02/2014   ? First degree AV block 03/19/2014     03/18/14--PR interval 440  MAC OV, increased from 340 in July on Sotalol 40 BID, Sotalol DC'd      ?  S/P ablation of atrial fibrillation 01/01/2014     08/14/13 [facility] [place], Dr. Hilarie Fredrickson     ? Atrial flutter (HCC) 12/27/2013     06/2013 Paroxysmal atrial flutter diagnosed in ER  09/03/13 Cryoablation for atrial fib via PVI x 4 &.  RF ablation of typical cavotricuspid isthmus atrial flutter. [facility], [facility]  10/15 Fatigue & Bradycardia w/ Sotalol,DC'd. Notes increase of some palpitations but cessation of fatigue and bradycardia  09/2017 Recurrent symptomatic atrial flutter, ablated Dr. Yetta Numbers, post ablation brady>>DDD Pacer implan     ? LAE (left atrial enlargement) 12/27/2013   ? Obesity 12/27/2013   ? Hypertension 12/26/2013     7/15 add HCTZ [facility] Clinic Echo 01/2014:  EF 65% Walls 1.1 c.w mild LVH.          ? Atrial fibrillation (HCC) 12/26/2013     08/14/2013: PVI for AF persistingw/ HR to 40s on Sotallol 40 BID @ ablation [facility], Dr. Hilarie Fredrickson same setting as atrial flutter  Ablation, continues .sotalol,  Sotalol DC'd due to malainse, fatigue  March 2016 Ziopatch 2 week monitor off sotalol  showed no atrial fibrillation with  sinus rhythm during some palpitations.      ? Hyperlipemia 12/26/2013     08/2013 total 167, trig 58 HDL 67 LDL 89 on Simva 40           Review of Systems   Constitutional: Negative.   HENT: Negative.    Eyes: Negative.    Cardiovascular: Negative.    Respiratory: Negative.    Endocrine: Negative.    Hematologic/Lymphatic: Negative.    Skin: Negative.    Musculoskeletal: Negative.    Gastrointestinal: Negative.    Genitourinary: Negative.    Neurological: Negative.    Psychiatric/Behavioral: Negative.    Allergic/Immunologic: Negative.    All other systems reviewed and are negative.      Physical Exam  General: No distress, looks generally healthy. Skin warm and dry, non icteric, Appearance consistent with calc'd BMI 37. Wearing medical face mask    Mucous membranes moist. Pupils equal and round Carotids: no bruits Thyroid not enlarged.  Neck veins: CVP <6 normal, no V wave, no HJR   Respiratory: Breathing comfortably. Lungs clear to percussion & auscultation. No rales, rhonchi or wheezing   Thorax: Left distal subclavicular pacing device well-positioned with a healed incision and no inflammation or tenderness.    Cardiac: Regular rhythm. LV impulse not palpable. Normal S1 & S2, Fourth heart sound, no rub or S3. No murmur.   Abdomen: soft, non-tender, no masses,bruits,hepatic or aortic enlargement. + bowel sounds.   Femoral arteries: Good pulses, no bruits. Legs/feet: Normal PT pulses, no edema.   Motor: Normal muscle strength. Cognitive: Pleasant demeanor. Good insight. No depression     Cardiovascular Studies  EKG shows AV pacing with no ectopy.    Cardiovascular Health Factors  Vitals BP Readings from Last 3 Encounters:   06/10/21 128/70   04/29/20 (!) 140/80   05/08/19 (!) 148/70     Wt Readings from Last 3 Encounters:   06/10/21 79.4 kg (175 lb)   04/29/20 78.7 kg (173 lb 6.4 oz)   05/08/19 78.9 kg (174 lb)     BMI Readings from Last 3 Encounters:   06/10/21 37.86 kg/m?   04/29/20 37.52 kg/m?   05/08/19 37.65 kg/m?      Smoking Social History     Tobacco Use   Smoking Status Former   ?  Years: 20.00   ? Types: Cigarettes   ? Quit date: 01/01/1984   ? Years since quitting: 37.4   Smokeless Tobacco Never      Lipid Profile Cholesterol   Date Value Ref Range Status   06/22/2019 184  Final     HDL   Date Value Ref Range Status   06/22/2019 77  Final     LDL   Date Value Ref Range Status   06/22/2019 98  Final     Triglycerides   Date Value Ref Range Status   06/22/2019 45  Final      Blood Sugar Hemoglobin A1C   Date Value Ref Range Status   06/27/2018 6.0 (H) <5.7 Final     Glucose   Date Value Ref Range Status   06/22/2019 95  Final   06/27/2018 107  Final   11/04/2017 101 (H) 70 - 100 MG/DL Final          Problems Addressed Today  Encounter Diagnoses   Name Primary?   ? Persistent atrial fibrillation (HCC) Yes   ? Screening for heart disease        Assessment and Plan     I reviewed Deborah's episode with her in detail.  This does not sound as entertaining ominous or in need of further follow-up.  I do not know the exact nature of the rhythm disturbance but has not recurred and nothing was seen during her overnight stay alone more than I think we can resume this was an isolated rhythm disturbance that is unlikely to be detected in the absence of symptoms now more than 2 months after the event.    We talked about her obesity which is her principal cardiac risk factor.  Counseled her about the possibility of using Ozempic which is in a drug its expensive but highly effective in inducing weight loss as of 30 to 50 pounds.  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  The drug is approved for diabetes and is easier to obtain lower experience in patients with diabetes.  She is considering bariatric surgery but I do not think she should pursue that avenue as there is a new area of weight loss drugs approaching that time may be safer and just as effective for her.    I did not make a referral to a weight loss program at [facility] but that is a possibility in the future as well.    She continues on Eliquis without bleeding complications for stroke prevention for atrial fibrillation.    I do not need to see Morna more often than annually unless she has recurrent arrhythmias or some other new unexpected problem of a cardiovascular nature.    40 minutes were expended as the total time for this encounter.  The time was spent reviewing records,  interviewing patient, doing exam, developing diagnosis, creating treatment plan written in patient oriented  terminology  for the AVS, explaining it to the patient regarding extended counseling about the nature of the weight loss drugs and circumstances further diagnosis and entering further information in the EMR.    Patient Instructions   You are doing terrifically well.  I your bad heart rhythm day sounds like it was more of a scare than a real problem.  I would not recommend I do not recommend you having another heart rhythm monitor to wear at home because of this episode that occurred nearly  2 months ago and was not associated with anything bad when the ER people evaluated you in [place].    1 the drugs that really works well is called Ozempic.Your hemoglobin A1c may be high enough that you could qualify for Ozempic as a diabetes drug although if you are taking it for diabetes will still offer you the chance to lose weight.  When you do not have a diagnosis of diabetes   then the insurance companies are almost uniformly not going to pay for it.    New injectable drugs are on the horizon and may be able to help people lose meaningful amounts of weight in a way that could just not be be done in the past.  Talk with Dr. Kandis Ban about it.  We do not yet have a program that focuses on getting these drugs started in people but it something that is just around the corner.    Return to see me for a recheck in one year.   I can see you sooner if needed.   Use MyChart to message me or if necessary call in if you have problems or questions  Marissa Nestle, MD                Current Medications (including today's revisions)  ? alendronate (FOSAMAX) 70 mg tablet Take 1 tablet by mouth every 7 days.   ? apixaban (ELIQUIS) 5 mg tablet Take 1 tablet by mouth twice daily.   ? hydrochlorothiazide (HYDRODIURIL) 25 mg tablet Take 1 Tab by mouth daily.   ? lisinopril (PRINIVIL, ZESTRIL) 40 mg tablet Take 1 tablet by mouth daily.   ? simvastatin (ZOCOR) 40 mg tablet Take 40 mg by mouth at bedtime daily.   ? vitamins, multiple cap Take 1 Cap by mouth daily.

## 2021-08-11 ENCOUNTER — Encounter: Admit: 2021-08-11 | Discharge: 2021-08-11 | Payer: MEDICARE

## 2021-11-09 ENCOUNTER — Encounter: Admit: 2021-11-09 | Discharge: 2021-11-09 | Payer: MEDICARE

## 2022-02-13 ENCOUNTER — Encounter: Admit: 2022-02-13 | Discharge: 2022-02-13 | Payer: MEDICARE

## 2022-02-13 DIAGNOSIS — Z950 Presence of cardiac pacemaker: Secondary | ICD-10-CM

## 2022-05-10 ENCOUNTER — Encounter: Admit: 2022-05-10 | Discharge: 2022-05-10 | Payer: MEDICARE

## 2022-05-11 ENCOUNTER — Encounter: Admit: 2022-05-11 | Discharge: 2022-05-11 | Payer: MEDICARE

## 2022-05-25 ENCOUNTER — Encounter: Admit: 2022-05-25 | Discharge: 2022-05-25 | Payer: MEDICARE

## 2022-07-06 ENCOUNTER — Encounter: Admit: 2022-07-06 | Discharge: 2022-07-06 | Payer: MEDICARE

## 2022-08-11 ENCOUNTER — Encounter: Admit: 2022-08-11 | Discharge: 2022-08-11 | Payer: MEDICARE

## 2022-08-18 ENCOUNTER — Encounter: Admit: 2022-08-18 | Discharge: 2022-08-18 | Payer: MEDICARE

## 2022-08-18 ENCOUNTER — Ambulatory Visit: Admit: 2022-08-18 | Discharge: 2022-08-18 | Payer: MEDICARE

## 2022-08-18 DIAGNOSIS — I10 Essential (primary) hypertension: Secondary | ICD-10-CM

## 2022-08-18 DIAGNOSIS — I517 Cardiomegaly: Secondary | ICD-10-CM

## 2022-08-18 DIAGNOSIS — I4892 Unspecified atrial flutter: Secondary | ICD-10-CM

## 2022-08-18 DIAGNOSIS — I4819 Other persistent atrial fibrillation: Secondary | ICD-10-CM

## 2022-08-18 DIAGNOSIS — E669 Obesity, unspecified: Secondary | ICD-10-CM

## 2022-08-18 DIAGNOSIS — Z136 Encounter for screening for cardiovascular disorders: Secondary | ICD-10-CM

## 2022-08-18 DIAGNOSIS — E785 Hyperlipidemia, unspecified: Secondary | ICD-10-CM

## 2022-08-18 DIAGNOSIS — I480 Paroxysmal atrial fibrillation: Secondary | ICD-10-CM

## 2022-08-18 DIAGNOSIS — R001 Bradycardia, unspecified: Secondary | ICD-10-CM

## 2022-08-18 NOTE — Patient Instructions
Overall everything looks good.  Your exercise program is good you have no symptoms.  A little worried about some of your blood pressures at 140.  Your readings are more reliable at home and occasionally in the office.  I would like you to make sure your blood pressure cuff reads the same as our machine..    I like you to get an echocardiogram and lab work to make sure your heart function is normal.  It was 07/27/2018 when you last had an echo so it is reasonable to check.  You do not need a Doppler just a basic echo.  I Minna send a message to Dr. Cleatrice Burke about whether or not with your excellent track record of no A-fib on the pacer that we could safely stop your Eliquis.  Although the Eliquis is a safe drug if you are not getting any benefit from it you do not need it.  The question is if A-fib came back and we detected fast enough on the pacemaker.     Right now everything looks good.  Get the echo and check the blood pressure cuff from home in our office so that he could get a reading with your cuff and then a minute later with ours.  We have checked those every year so we no other accurate.  As long as your blood pressure cuff is accurate and it is reading under 130/80 all the time that is great    Return to see me for a recheck in one year.   I can see you sooner if needed.   Use MyChart to message me or if necessary call in if you have problems or questions  Lenoard Aden, MD

## 2022-08-23 ENCOUNTER — Encounter: Admit: 2022-08-23 | Discharge: 2022-08-23 | Payer: MEDICARE

## 2022-08-23 DIAGNOSIS — E669 Obesity, unspecified: Secondary | ICD-10-CM

## 2022-08-23 DIAGNOSIS — R001 Bradycardia, unspecified: Secondary | ICD-10-CM

## 2022-08-23 DIAGNOSIS — I10 Essential (primary) hypertension: Secondary | ICD-10-CM

## 2022-08-23 DIAGNOSIS — I480 Paroxysmal atrial fibrillation: Secondary | ICD-10-CM

## 2022-08-23 DIAGNOSIS — I517 Cardiomegaly: Secondary | ICD-10-CM

## 2022-08-23 DIAGNOSIS — E785 Hyperlipidemia, unspecified: Secondary | ICD-10-CM

## 2022-08-23 DIAGNOSIS — I4892 Unspecified atrial flutter: Secondary | ICD-10-CM

## 2022-09-09 ENCOUNTER — Encounter: Admit: 2022-09-09 | Discharge: 2022-09-09 | Payer: MEDICARE

## 2022-09-09 DIAGNOSIS — I10 Essential (primary) hypertension: Secondary | ICD-10-CM

## 2022-09-09 DIAGNOSIS — I517 Cardiomegaly: Secondary | ICD-10-CM

## 2022-09-09 DIAGNOSIS — Z9889 Other specified postprocedural states: Secondary | ICD-10-CM

## 2022-09-15 ENCOUNTER — Encounter: Admit: 2022-09-15 | Discharge: 2022-09-15 | Payer: MEDICARE

## 2022-09-27 ENCOUNTER — Encounter: Admit: 2022-09-27 | Discharge: 2022-09-27 | Payer: MEDICARE

## 2022-11-02 ENCOUNTER — Encounter: Admit: 2022-11-02 | Discharge: 2022-11-02 | Payer: MEDICARE

## 2022-11-12 ENCOUNTER — Encounter: Admit: 2022-11-12 | Discharge: 2022-11-12 | Payer: MEDICARE

## 2023-02-08 ENCOUNTER — Encounter: Admit: 2023-02-08 | Discharge: 2023-02-08 | Payer: MEDICARE

## 2023-02-08 DIAGNOSIS — Z950 Presence of cardiac pacemaker: Secondary | ICD-10-CM

## 2023-02-08 DIAGNOSIS — R001 Bradycardia, unspecified: Secondary | ICD-10-CM

## 2023-02-10 ENCOUNTER — Encounter: Admit: 2023-02-10 | Discharge: 2023-02-10 | Payer: MEDICARE

## 2023-05-12 ENCOUNTER — Encounter: Admit: 2023-05-12 | Discharge: 2023-05-12 | Payer: MEDICARE

## 2023-05-16 ENCOUNTER — Encounter: Admit: 2023-05-16 | Discharge: 2023-05-16 | Payer: MEDICARE

## 2023-05-16 DIAGNOSIS — I4819 Other persistent atrial fibrillation: Secondary | ICD-10-CM

## 2023-05-20 ENCOUNTER — Ambulatory Visit: Admit: 2023-05-20 | Discharge: 2023-05-21 | Payer: MEDICARE

## 2023-06-20 ENCOUNTER — Encounter: Admit: 2023-06-20 | Discharge: 2023-06-20 | Payer: MEDICARE

## 2023-06-20 ENCOUNTER — Ambulatory Visit: Admit: 2023-06-20 | Discharge: 2023-06-21 | Payer: MEDICARE

## 2023-06-20 DIAGNOSIS — Z136 Encounter for screening for cardiovascular disorders: Secondary | ICD-10-CM

## 2023-06-20 DIAGNOSIS — Z9889 Other specified postprocedural states: Secondary | ICD-10-CM

## 2023-06-20 DIAGNOSIS — I482 Chronic atrial fibrillation, unspecified: Secondary | ICD-10-CM

## 2023-06-20 DIAGNOSIS — Z7901 Long term (current) use of anticoagulants: Secondary | ICD-10-CM

## 2023-06-20 DIAGNOSIS — Z950 Presence of cardiac pacemaker: Secondary | ICD-10-CM

## 2023-06-20 NOTE — Patient Instructions
As we discussed today I think it is time to stop the Eliquis.  I was thinking about it last year as well but is I reviewed the chart today and see the pacemaker report report is completely free of A-fib it is now time.    If you are what starts show showing unusual heart rates that to sign you might have atrial fib.  You can go to our office to get an EKG to be sure.    I think it is time to transfer your care to a different cardiologist.  I am withdrawing from general cardiology practice over the course of the next year.  I think Dr. Bufford Buttner in our [facility] office would be a great choice for you.  I am not sure if he is taking new patients or not but if it is a different physician that you are offered up there for 6 months visit I can guarantee you that you will be getting good care.    I am going to check before you leave to make sure that your pacemaker interrogations done from home are set to alert me if you have atrial Ffibrillation.  Stay with your exercise program.  An hour at a time is really good for you  .  Keep track your blood pressures.    Make sure Dr. Kandis Ban is satisfied with your bad cholesterol reading which was under 100 on the last reading we saw.    It has been a pleasure being your doctor.  My Chart is the best way to communicate with [place] but if you prefer, call in if you have problems or questions.   Marissa Nestle, MD

## 2023-06-20 NOTE — Progress Notes
Date of Service: 06/20/2023    Hannah Andrade is a 78 y.o. female.       HPI     Hannah Andrade returns for a general cardiology follow-up evaluation of her coronary artery disease risk factors.   She is a very pleasant 78 year old woman with a history of a flutter and A-fib ablation with out recurrent arrhythmias AV block after ablation that led to a dual-chamber pacer and ongoing oral anticoagulation for primary prevention of stroke related to atrial fibrin currents.        Since I saw Hannah Andrade last March she has had no major adverse events related to her heart.  Her activity level is good her blood pressures are always under 130/80.  She is extremely surprised by the reading 1 40-76 today.  She is adherent to her medical program with lisinopril 40 and HCTZ 25.  She is on no long-term antiarrhythmic therapy.  She is 6 years out from A-fib ablation and remains on Eliquis.  When I saw her last year I out was vacillating about stopping Eliquis and sought input from Dr. Bradly Bienenstock heart rhythm specialist..    Her rhythm disturbances were initially treated in [place] including paroxysmal atrial fibrillation  pulmonary vein antral isolation procedure and caval tricuspid isthmus ablation, and recurrent atrial flutter with slow ventricular response status post redo atypical atrial flutter ablation on 10/06/2017 with Dr Bradly Bienenstock.  She was noted to be in Mobitz type 1and 2:1 AV block with rates 40-50s  and implant of dual-chamber permanent pacer by Dr. Bradly Bienenstock on 11/04/17.     She states that she is free from exertional chest pain or pressure that limits activity.  She is having no PND, orthopnea,  edema, tachypalpitations, syncope, near syncope, or postural lightheadedness.  She has had no  focal motor defects, speech defects or cognitive defects that would suggest TIA. She is not limiting her activity in order to avoid chest discomfort or shortness of breath with exertion.Marland Kitchen     She wears a heart rate monitoring watch and knows that her resting heart rate is generally in the 60s and virtually never approaches 90 to 100 bpm at rest.       She has had no loss of functional capacity over the past 10 months since I have seen her last.  She commonly walks on the treadmill for an hour at a time at 3 mph with no loss of stamina recently.    When I saw her last year I asked her to get a surveillance echo to be certain that she had not had any changes in function possibly attributable to her dual-chamber pacer.  Ejection fraction in May 2024 was 50 to 55% unchanged from prior study of December 2020 with normal estimated CVP no significant valve abnormalities no effusion.       Vitals:    06/20/23 0957   BP: (!) 142/76   BP Source: Arm, Right Upper   Pulse: 60   SpO2: 96%   O2 Device: None (Room air)   PainSc: Zero   Weight: 79.4 kg (175 lb)   Height: 144.8 cm (4' 9)     Body mass index is 37.87 kg/m?Marland Kitchen     Past Medical History  Patient Active Problem List    Diagnosis Date Noted    Pacemaker 01/07/2018     DDD Pacer implant Dr. 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   Post 2019 atrial flutter ablation bradycardia. Pacer implant Dr. Bradly Bienenstock      On continuous oral anticoagulation 08/31/2016    At risk for cardiovascular event: Normal cor angio 2015 08/12/2015     08/2013: Patient reports having normal coronary angiography done prior to atrial fibrillation Ablation, [facility], [facility].       Primary osteoarthritis of both knees 08/12/2015     05/12/15 right TKA  [facility] Dr. Aundria Rud, no problems  2018 L TKA Dr. Aundria Rud      Palpitations 09/02/2014    First degree AV block 03/19/2014     03/18/14--PR interval 440  MAC OV, increased from 340 in July on Sotalol 40 BID, Sotalol DC'd       S/P ablation of atrial fibrillation 01/01/2014     08/14/13 [facility] [place], Dr. Hilarie Fredrickson      Atrial flutter [facility]) 12/27/2013     06/2013 Paroxysmal atrial flutter diagnosed in ER  09/03/13 Cryoablation for atrial fib via PVI x 4 &.  RF ablation of typical cavotricuspid isthmus atrial flutter. [facility], [facility]  10/15 Fatigue & Bradycardia w/ Sotalol,DC'd. Notes increase of some palpitations but cessation of fatigue and bradycardia  09/2017 Recurrent symptomatic atrial flutter, ablated Dr. Yetta Numbers, post ablation brady>>DDD Pacer implan      LAE (left atrial enlargement) 12/27/2013    Class 2 obesity due to excess calories without serious comorbidity with body mass index (BMI) of 37.0 to 37.9 in adult 12/27/2013    Hypertension 12/26/2013     7/15 add HCTZ [facility] Echo 01/2014:  EF 65% Walls 1.1 c.w mild LVH.           Atrial fibrillation (HCC) 12/26/2013     08/14/2013: PVI for AF persistingw/ HR to 40s on Sotallol 40 BID @ [facility], Dr. Hilarie Fredrickson same setting as atrial flutter  Ablation, continues .sotalol,  Sotalol DC'd due to malainse, fatigue  March 2016 Ziopatch 2 week monitor off sotalol  showed no atrial fibrillation with  sinus rhythm during some palpitations.       Hyperlipemia 12/26/2013     08/2013 total 167, trig 58 HDL 67 LDL 89 on Simva 40           Review of Systems   Constitutional: Negative.   HENT: Negative.     Eyes: Negative.    Cardiovascular: Negative.    Respiratory: Negative.     Endocrine: Negative.    Hematologic/Lymphatic: Negative.    Skin: Negative.    Musculoskeletal: Negative.    Gastrointestinal: Negative.    Genitourinary: Negative.    Neurological: Negative.    Psychiatric/Behavioral: Negative.     Allergic/Immunologic: Negative.        Physical Exam  General: No distress, looks generally healthy. Skin warm and dry, non icteric, Appearance consistent with calc'd BMI 37. Wearing medical face mask    Mucous membranes moist. Pupils equal and round Carotids: no bruits Thyroid not enlarged.  Neck veins: CVP <6 normal, no V wave, no HJR   Respiratory: Breathing comfortably. Lungs clear to percussion & auscultation. No rales, rhonchi or wheezing   Thorax: Left distal subclavicular pacing device well-positioned with a healed incision pacemaker over the left side good   cardiac: Regular rhythm. LV impulse not palpable. Normal S1 & S2, Fourth heart sound, no rub or S3. No murmur.  Abdomen: soft, non-tender, no masses,bruits,hepatic or aortic enlargement. + bowel sounds.   Femoral arteries: Good pulses, no bruits. Legs/feet: Normal PT pulses, no edema.   Motor: Normal muscle strength. Cognitive: Pleasant demeanor. Good insight. No depression     Cardiovascular Studies  EKG today shows AV pacing rate 60.    In office pacemaker interrogation today reveals no atrial fibrillation no significant ventricular ectopy and no pacer malfunction.  The ASCVD Risk score (Arnett DK, et al., 2019) failed to calculate for the following reasons:    Cannot find a previous HDL lab    Cannot find a previous total cholesterol lab     Cardiovascular Health Factors  Vitals BP Readings from Last 3 Encounters:   06/20/23 (!) 142/76   11/02/22 138/70   08/18/22 (!) 140/72     Wt Readings from Last 3 Encounters:   06/20/23 79.4 kg (175 lb)   11/02/22 78.5 kg (173 lb)   08/18/22 78.7 kg (173 lb 6.4 oz)     BMI Readings from Last 3 Encounters:   06/20/23 37.87 kg/m?   11/02/22 37.44 kg/m?   08/18/22 37.52 kg/m?      Smoking Social History     Tobacco Use   Smoking Status Former    Current packs/day: 0.00    Types: Cigarettes    Start date: 01/01/1964    Quit date: 01/01/1984    Years since quitting: 39.4   Smokeless Tobacco Never      Lipid Profile Cholesterol   Date Value Ref Range Status   06/22/2019 184  Final     HDL   Date Value Ref Range Status   06/22/2019 77  Final     LDL   Date Value Ref Range Status   06/22/2019 98  Final     Triglycerides   Date Value Ref Range Status   06/22/2019 45  Final      Blood Sugar Hemoglobin A1C   Date Value Ref Range Status 06/27/2018 6.0 (H) <5.7 Final     Glucose   Date Value Ref Range Status   06/22/2019 95  Final   06/27/2018 107  Final   11/04/2017 101 (H) 70 - 100 MG/DL Final          Problems Addressed Today  Encounter Diagnoses   Name Primary?    Screening for heart disease Yes       Assessment and Plan       Mrs. Clater's cardiac status is fully satisfactory.  She has had no apparent recurrence of atrial fibrillation since her ablation more than 5 years ago.  Her device interrogation today shows no atrial fibrillation.  She remains on Eliquis but I think at this point with continued A-fib free symptom profile and pacer interrogation pattern that her risk of bleeding complications from the Eliquis exceeds the potential benefits related to stroke prevention.  We will continue to monitor her heart rhythm via the pacer.  She knows to call promptly if she notes any variations in heart rate that could suggest atrial fibs with rapid response.    Her blood pressure control is good at home.    We do not have the lipid profile on her accessible through care everywhere but she assures me that Dr. Annia Belt is checking lipids regularly and they are in the range that is satisfactory.  Her last lipid profile we had have on her shows LDL under 100 which is satisfactory for her given her lack of major cardiovascular events  other than the A-fib and the pacer which are not related to lipids.    Annual follow-up is all that is needed.  I will make sure we are tracking A-fib recurrence on her device in a manner that will identify onset of A-fib promptly with device interrogation.  I counseled her about watching for heart rate changes that would suggest atrial fibs and to notify us if any are noted.    I am withdrawing from general cardiology practice and and in 2026 when she is due for her next in office visit my practice will be solely cardio oncology.  I am directing her to see Dr. Danae Chen make or other clinician working in our [facility] office where she can continue to get all her pacemaker care.  Until she sees Dr. Bufford Buttner in 6 months I will continue to manage issues that arise.    Her pacemaker was programmed to send alert for A-fib more than 6 hours.  Today I asked the EP team to reduce the threshold for 1 hour notification of atrial fibs.    30 minutes were spent  today in the care of this patient and completion of this encounter.  The time was spent reviewing records,  interviewing patient, doing exam, developing diagnosis, creating treatment plan written in patient oriented  terminology  for the AVS, explaining it to the patient and entering further information in the EMR.      NB: The free text in this document was generated through Dragon(TM) software with editing and proofreading  done by the author of this document Dr. Mable Paris MD, [facility] principally at the point of care. Some errors may persist. If there are questions about content in this document please contact Dr. Hale Bogus.  The written information I provided the patient at the conclusion of today's encounter is as follows:  Patient Instructions   As we discussed today I think it is time to stop the Eliquis.  I was thinking about it last year as well but is I reviewed the chart today and see the pacemaker report report is completely free of A-fib it is now time.    If you are what starts show showing unusual heart rates that to sign you might have atrial fib.  You can go to our office to get an EKG to be sure.    I think it is time to transfer your care to a different cardiologist.  I am withdrawing from general cardiology practice over the course of the next year.  I think Dr. Bufford Buttner in our [facility] office would be a great choice for you.  I am not sure if he is taking new patients or not but if it is a different physician that you are offered up there for 6 months visit I can guarantee you that you will be getting good care.    I am going to check before you leave to make sure that your pacemaker interrogations done from home are set to alert me if you have atrial Ffibrillation.  Stay with your exercise program.  An hour at a time is really good for you  .  Keep track your blood pressures.    Make sure Dr. Kandis Ban is satisfied with your bad cholesterol reading which was under 100 on the last reading we saw.    It has been a pleasure being your doctor.  My Chart is the best way to communicate with [place] but if you prefer, call in if  you have problems or questions.   Marissa Nestle, MD                  Current Medications (including today's revisions)   alendronate (FOSAMAX) 70 mg tablet Take one tablet by mouth every 7 days.    apixaban (ELIQUIS) 5 mg tablet Take 1 tablet by mouth twice daily.    hydrochlorothiazide (HYDRODIURIL) 25 mg tablet Take 1 Tab by mouth daily.    lisinopril (PRINIVIL, ZESTRIL) 40 mg tablet Take one tablet by mouth daily.    simvastatin (ZOCOR) 40 mg tablet Take one tablet by mouth at bedtime daily.    vitamins, multiple cap Take one capsule by mouth daily.

## 2023-07-14 ENCOUNTER — Encounter: Admit: 2023-07-14 | Discharge: 2023-07-14 | Payer: MEDICARE

## 2023-07-26 ENCOUNTER — Encounter: Admit: 2023-07-26 | Discharge: 2023-07-26 | Payer: MEDICARE

## 2023-08-09 ENCOUNTER — Encounter: Admit: 2023-08-09 | Discharge: 2023-08-10 | Payer: MEDICARE

## 2023-11-12 ENCOUNTER — Encounter: Admit: 2023-11-12 | Discharge: 2023-11-12 | Payer: MEDICARE

## 2023-11-13 ENCOUNTER — Encounter: Admit: 2023-11-13 | Discharge: 2023-11-12 | Payer: MEDICARE

## 2023-12-27 ENCOUNTER — Encounter: Admit: 2023-12-27 | Discharge: 2023-12-27 | Payer: MEDICARE

## 2023-12-28 ENCOUNTER — Encounter: Admit: 2023-12-28 | Discharge: 2023-12-28 | Payer: MEDICARE

## 2023-12-28 NOTE — Progress Notes
 Hannah Andrade, [birth year] has an appointment with Dr. Albina on 01/04/24.    Please send recent lab results for continuity of care.    Thank you,   Hannah Andrade    Phone: [phone]  Fax: [phone]

## 2023-12-30 ENCOUNTER — Encounter: Admit: 2023-12-30 | Discharge: 2023-12-30 | Payer: MEDICARE

## 2023-12-30 ENCOUNTER — Ambulatory Visit: Admit: 2023-12-30 | Discharge: 2023-12-30 | Payer: MEDICARE

## 2024-01-04 ENCOUNTER — Encounter: Admit: 2024-01-04 | Discharge: 2024-01-04 | Payer: MEDICARE

## 2024-01-04 ENCOUNTER — Ambulatory Visit: Admit: 2024-01-04 | Discharge: 2024-01-05 | Payer: MEDICARE

## 2024-01-04 DIAGNOSIS — I480 Paroxysmal atrial fibrillation: Secondary | ICD-10-CM

## 2024-01-04 DIAGNOSIS — Z9889 Other specified postprocedural states: Secondary | ICD-10-CM

## 2024-01-04 DIAGNOSIS — I442 Atrioventricular block, complete: Secondary | ICD-10-CM

## 2024-01-04 DIAGNOSIS — E785 Hyperlipidemia, unspecified: Secondary | ICD-10-CM

## 2024-01-04 DIAGNOSIS — Z950 Presence of cardiac pacemaker: Secondary | ICD-10-CM

## 2024-01-04 DIAGNOSIS — I10 Essential (primary) hypertension: Secondary | ICD-10-CM

## 2024-01-04 NOTE — Progress Notes
 Date of Service: 01/04/2024    Hannah Andrade is a 78 y.o. female.       HPI       Dear Karleen I had the pleasure of seeing Hannah Andrade in my office today for a cardiovascular follow-up.  She has been normally followed by Dr. Fayette.  Dr. Fayette is retiring from general cardiology practice and therefore she was referred to see me here in the [place] office.  Today she was here along with her husband.    She is a very pleasant 4 year old with history of atrial fibrillation and atrial flutter.  Her initial atrial fibrillation ablation was done in [place] .  She had a redo ablation for atypical atrial flutter on Oct 06, 2017 with Dr. Laurian.  She was noted to have intermittent AV block.  On Nov 04, 2017 she had a dual-chamber permanent pacemaker.  In addition to that she has a history of hypertension and hyperlipidemia.  She has no known history of coronary artery disease or previous myocardial infarction.    During her last visit with Dr. Fayette,  Dr. Fayette decided to discontinue her Eliquis .  He felt that her bleeding risk was high as compared to her risk of stroke and atrial fibrillation can be followed on her pacemaker interrogations.  Patient has been having intermittent palpitations.  She has noticed this once or twice a week.  However these episodes do not seem to last for more than a couple of minutes.  She denies any chest pains or shortness of breath.  No history of any orthopnea paroxysmal nocturnal dyspnea.  No history of any dizziness or lightheadedness.    She is very active.  She retired as a Interior and spatial designer.  She exercises regularly.  She walks 2-2-1/2 miles a day generally 7 days a week.    On December 30, 2023 her pacemaker did reveal an episode of atrial fibrillation.  Patient did not feel this episode.  This was an asymptomatic episode.  Overall burden still is very low on the pacemaker checks.    My concern is that patient had an episode of asymptomatic atrial fibrillation.  She is having her pacemaker checked every 3 months.  In my opinion she does not have a very high risk of bleeding.  She is very active.  She has no issues with any balance.  She has never had a fall.  I felt that she should be back on the Eliquis  at least for now.  I have sent a message to Dr. Bella to ask her opinion about continuation of the Eliquis  or discontinuing the Eliquis  and monitoring her symptoms on pacemaker checks.    Other than that clinically and hemodynamically she is doing well.  Blood pressures are pretty well-controlled.  No other changes with her medications.  Will continue with the annual follow-up.    This was my first office visit with Hannah.  Total time spent on this office consultation was 45 minutes.  15 minutes was spent in reviewing patient's medical records prior to and in preparation to be office consultation.  Extra time was spent in getting an detailed history, performing physical examination as well as reviewing my recommendation and answering her questions.          Vitals:    01/04/24 1559 01/04/24 1607   BP: 138/68 (!) 140/68   BP Source: Arm, Left Upper Arm, Right Upper   Pulse: 60    PainSc: Zero    Weight: 80.6 kg (177  lb 12.8 oz)    Height: 144.8 cm (4' 9)      Body mass index is 38.48 kg/m?SABRA     Past Medical History  Patient Active Problem List    Diagnosis Date Noted    Pacemaker 01/07/2018     DDD Pacer implant Dr. Laurian afpter 2nd atrial flutter ablation      Mobitz type 1 second degree AV block 11/03/2017    Bradycardia 11/03/2017     Chronic HR 40s - 50s on sotalol 40 BID.   Post 2019 atrial flutter ablation bradycardia. Pacer implant Dr. Laurian      On continuous oral anticoagulation 08/31/2016    At risk for cardiovascular event: Normal cor angio 2015 08/12/2015     08/2013: Patient reports having normal coronary angiography done prior to atrial fibrillation Ablation, [facility], [place].       Primary osteoarthritis of both knees 08/12/2015     05/12/15 right TKA  [facility] Dr. Jude, no problems  2018 L TKA Dr. Jude      Palpitations 09/02/2014    First degree AV block 03/19/2014     03/18/14--PR interval 440  MAC OV, increased from 340 in July on Sotalol 40 BID, Sotalol DC'd       S/P ablation of atrial fibrillation 01/01/2014     08/14/13 [facility] [place], Dr. Cain      Atrial flutter (CMS-HCC) 12/27/2013     06/2013 Paroxysmal atrial flutter diagnosed in ER  09/03/13 Cryoablation for atrial fib via PVI x 4 &.  RF ablation of typical cavotricuspid isthmus atrial flutter. [facility], [place]  10/15 Fatigue & Bradycardia w/ Sotalol,DC'd. Notes increase of some palpitations but cessation of fatigue and bradycardia  09/2017 Recurrent symptomatic atrial flutter, ablated Dr. Laurian [facility], post ablation brady>>DDD Pacer implan      LAE (left atrial enlargement) 12/27/2013    Class 2 obesity due to excess calories without serious comorbidity with body mass index (BMI) of 37.0 to 37.9 in adult 12/27/2013    Hypertension 12/26/2013     7/15 add HCTZ [facility] Clinic Echo 01/2014:  EF 65% Walls 1.1 c.w mild LVH.           Atrial fibrillation (CMS-HCC) 12/26/2013     08/14/2013: PVI for AF persistingw/ HR to 40s on Sotallol 40 BID @ [facility] [place], Dr. Halleran same setting as atrial flutter  Ablation, continues .sotalol,  Sotalol DC'd due to malainse, fatigue  March 2016 Ziopatch 2 week monitor off sotalol  showed no atrial fibrillation with  sinus rhythm during some palpitations.       Hyperlipemia 12/26/2013     08/2013 total 167, trig 58 HDL 67 LDL 89 on Simva 40           Review of Systems   Constitutional: Negative.   HENT: Negative.     Eyes: Negative.    Cardiovascular: Negative.    Respiratory: Negative.     Endocrine: Negative.    Hematologic/Lymphatic: Negative.    Skin: Negative.    Gastrointestinal: Negative.    Genitourinary: Negative.    Neurological: Negative.    Psychiatric/Behavioral: Negative. Allergic/Immunologic: Negative.      Physical Exam  General Appearance: resting comfortably, no acute distress  Eyes: conjunctivae and lids normal  Lips & Oral Mucosa: no pallor or cyanosis  Neck Veins: neck veins are not distended  Thyroid: no nodules, masses, tenderness or enlargement  Chest Inspection: chest is  normal in appearance  Respiratory Effort: breathing is unlabored, no respiratory distress  Auscultation/Percussion: lungs clear to auscultation, no rales, rhonchi, or wheezing  PMI: PMI not enlarged or displaced  Cardiac Rhythm: regular rhythm  Cardiac Auscultation: Normal S1 & S2, no S3 or S4, no rub  Murmurs: no cardiac murmurs   Carotid Arteries: normal carotid upstroke bilaterally, no bruits  Pedal Pulses: normal symmetric pedal pulses  Lower Extremity Edema: no lower extremity edema  Abdominal Exam: soft, non-tender, no masses, bowel sounds normal  Abdominal Aorta: no abdominal bruits  Liver & Spleen: no organomegaly  Neurologic Exam: neurological assessment grossly intact  Orientation: oriented to time, place and person  Affect & Mood: appropriate and sustained affect  Other: moves all extremities      Cardiovascular Studies  Nov 02, 2022 echocardiogram:  Normal left ventricular size and wall thickness.  Low normal left ventricular systolic function with an estimated ejection fraction of 50-55 %.  Unable to assess left ventricular diastolic function.  Normal right ventricular size and systolic function.  Device leads present in right heart.  Normal biatrial size.  Normal central venous pressure (0-5 mm Hg).  Suboptimally visualized cardiac valves.  No hemodynamically significant valvular abnormalities.  The aortic root and ascending aorta are normal in size.  No pericardial effusion.  Comparison is made to a transthoracic study dated 05/08/2019. No significant changes found.  Left ventricular systolic function appears stable when compared to prior.    EKG today shows AV paced rhythm at a rate of 60 bpm.    Cardiovascular Health Factors  Vitals BP Readings from Last 3 Encounters:   01/04/24 (!) 140/68   06/20/23 (!) 142/76   11/02/22 138/70     Wt Readings from Last 3 Encounters:   01/04/24 80.6 kg (177 lb 12.8 oz)   06/20/23 79.4 kg (175 lb)   11/02/22 78.5 kg (173 lb)     BMI Readings from Last 3 Encounters:   01/04/24 38.48 kg/m?   06/20/23 37.87 kg/m?   11/02/22 37.44 kg/m?      Smoking Social History     Tobacco Use   Smoking Status Former    Current packs/day: 0.00    Types: Cigarettes    Start date: 01/01/1964    Quit date: 01/01/1984    Years since quitting: 40.0   Smokeless Tobacco Never      Lipid Profile Cholesterol   Date Value Ref Range Status   06/22/2019 184  Final     HDL   Date Value Ref Range Status   06/22/2019 77  Final     LDL   Date Value Ref Range Status   06/22/2019 98  Final     Triglycerides   Date Value Ref Range Status   06/22/2019 45  Final      Blood Sugar Hemoglobin A1C   Date Value Ref Range Status   06/27/2018 6.0 (H) <5.7 Final     Glucose   Date Value Ref Range Status   06/22/2019 95  Final   06/27/2018 107  Final   11/04/2017 101 (H) 70 - 100 MG/DL Final          Problems Addressed Today  Encounter Diagnoses   Name Primary?    Persistent atrial fibrillation (CMS-HCC) Yes    Atrial fibrillation, unspecified type (CMS-HCC)        Assessment and Plan     In summary Ariel is a very pleasant 78 year old with history of atrial fibrillation and atrial  flutter status post ablation, intermittent complete heart block status post permanent pacemaker, hypertension and hyperlipidemia.  She had an episode of asymptomatic atrial fibrillation on a recent pacemaker interrogation.  She continues to feel intermittent palpitations though these episodes last only for few minutes at a time and few times a week.  Other than that she is doing really well.  Blood pressures were good.  No signs of volume overload.  Plan as have discussed above.         Current Medications (including today's revisions)   alendronate (FOSAMAX) 70 mg tablet Take one tablet by mouth every 7 days.    hydrochlorothiazide  (HYDRODIURIL ) 25 mg tablet Take 1 Tab by mouth daily.    lisinopril  (PRINIVIL , ZESTRIL ) 40 mg tablet Take one tablet by mouth daily.    simvastatin  (ZOCOR ) 40 mg tablet Take one tablet by mouth at bedtime daily.    vitamins, multiple cap Take one capsule by mouth daily.

## 2024-01-04 NOTE — Patient Instructions
 Thank you for visiting our office today.   We recommend follow-up with our office in one year.   Please call [phone] in 6months to schedule the office visit Dr. Albina ordered.     Please complete the following orders:     Restart Eliquis  5mg  daily until you hear back from our office. We will call you back and let you know if you are to continue it. We will not send in a new script yet.     Take your medications as ordered.   Check your list with what you have on hand at home.   Should you have any additional questions or concerns, please message me through MyChart or call the office.    Cardiovascular Medicine Team   Clinic phone: [phone]

## 2024-01-05 DIAGNOSIS — I4891 Unspecified atrial fibrillation: Principal | ICD-10-CM

## 2024-01-10 ENCOUNTER — Encounter: Admit: 2024-01-10 | Discharge: 2024-01-10 | Payer: MEDICARE

## 2024-02-03 ENCOUNTER — Encounter: Admit: 2024-02-03 | Discharge: 2024-02-03 | Payer: MEDICARE

## 2024-02-03 DIAGNOSIS — I4892 Unspecified atrial flutter: Principal | ICD-10-CM

## 2024-02-18 ENCOUNTER — Encounter: Admit: 2024-02-18 | Discharge: 2024-02-17 | Payer: MEDICARE

## 2024-05-04 ENCOUNTER — Encounter: Admit: 2024-05-04 | Discharge: 2024-05-04 | Payer: MEDICARE

## 2024-05-04 MED ORDER — ELIQUIS 5 MG PO TAB
5 mg | ORAL_TABLET | Freq: Two times a day (BID) | ORAL | 5 refills | 30.00000 days | Status: AC
Start: 2024-05-04 — End: ?

## 2024-05-04 NOTE — Telephone Encounter [36]
 URGENT REQUEST FROM [facility]     STAT REQUEST FOR MEDICAL RECORDS FOR:     Hannah Andrade DOB 01/04/1946      PLEASE FAX:                       MOST RECENT LAB RESULTS (2025)    PLEASE FAX TO [phone]  ATTENTION:  [facility] Nurse Team     [facility] YOU!

## 2024-05-04 NOTE — Telephone Encounter [36]
 05/04/2024 5:45 PM   Protocol and chart reviewed. Refill approved. Patient due for labs. Most recent labs requested from PCP

## 2024-05-12 ENCOUNTER — Encounter: Admit: 2024-05-12 | Discharge: 2024-05-12 | Payer: MEDICARE

## 2024-07-10 ENCOUNTER — Encounter: Admit: 2024-07-10 | Discharge: 2024-07-10 | Payer: MEDICARE

## 2024-07-10 NOTE — Progress Notes [1]
 07/10/2024 2:27 PM    Received a fax from [facility] requesting that pt. DC Eliquis  and switch to Xarelto d/t cost. Will review with Dr. Albina.
